# Patient Record
Sex: Male | Born: 1995 | Race: Black or African American | Hispanic: No | Marital: Single | Smoking: Current every day smoker
Health system: Southern US, Community
[De-identification: ages and names within clinical notes are randomized; demographics above are authoritative.]

---
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
## 2014-08-05 ENCOUNTER — Emergency Department (HOSPITAL_COMMUNITY): Payer: Medicaid Other

## 2014-08-05 ENCOUNTER — Emergency Department (HOSPITAL_COMMUNITY)
Admission: EM | Admit: 2014-08-05 | Discharge: 2014-08-05 | Disposition: A | Discharge status: Home / Self Care | Payer: Medicaid Other | Attending: Emergency Medicine | Admitting: Emergency Medicine

## 2014-08-05 ENCOUNTER — Encounter (HOSPITAL_COMMUNITY): Payer: Self-pay | Admitting: Emergency Medicine

## 2014-08-05 DIAGNOSIS — Z23 Encounter for immunization: Secondary | ICD-10-CM | POA: Insufficient documentation

## 2014-08-05 DIAGNOSIS — R21 Rash and other nonspecific skin eruption: Secondary | ICD-10-CM | POA: Diagnosis not present

## 2014-08-05 DIAGNOSIS — Y9389 Activity, other specified: Secondary | ICD-10-CM | POA: Diagnosis not present

## 2014-08-05 DIAGNOSIS — Y288XXA Contact with other sharp object, undetermined intent, initial encounter: Secondary | ICD-10-CM | POA: Diagnosis not present

## 2014-08-05 DIAGNOSIS — Z791 Long term (current) use of non-steroidal anti-inflammatories (NSAID): Secondary | ICD-10-CM | POA: Diagnosis not present

## 2014-08-05 DIAGNOSIS — Z720 Tobacco use: Secondary | ICD-10-CM | POA: Diagnosis not present

## 2014-08-05 DIAGNOSIS — Y998 Other external cause status: Secondary | ICD-10-CM | POA: Insufficient documentation

## 2014-08-05 DIAGNOSIS — S91331A Puncture wound without foreign body, right foot, initial encounter: Secondary | ICD-10-CM

## 2014-08-05 DIAGNOSIS — S99921A Unspecified injury of right foot, initial encounter: Secondary | ICD-10-CM | POA: Diagnosis present

## 2014-08-05 DIAGNOSIS — Y929 Unspecified place or not applicable: Secondary | ICD-10-CM | POA: Diagnosis not present

## 2014-08-05 MED ORDER — IBUPROFEN 800 MG PO TABS: 800.0000 mg | ORAL_TABLET | Freq: Three times a day (TID) | ORAL | Status: DC | PRN

## 2014-08-05 MED ORDER — IBUPROFEN 400 MG PO TABS
600.0000 mg | ORAL_TABLET | Freq: Once | ORAL | Status: AC
  Administered 2014-08-05: 600 mg via ORAL
  Filled 2014-08-05: qty 2

## 2014-08-05 MED ORDER — TETANUS-DIPHTH-ACELL PERTUSSIS 5-2.5-18.5 LF-MCG/0.5 IM SUSP
0.5000 mL | Freq: Once | INTRAMUSCULAR | Status: AC
  Administered 2014-08-05: 0.5 mL via INTRAMUSCULAR
  Filled 2014-08-05: qty 0.5

## 2014-08-05 MED ORDER — LEVOFLOXACIN 500 MG PO TABS: 500.0000 mg | ORAL_TABLET | Freq: Every day | ORAL | Status: DC

## 2014-08-05 NOTE — ED Provider Notes (Signed)
CSN: [id number]     Arrival date & time 08/05/14  1907 History  This chart was scribed for non-physician practitioner,Idamay Hosein Viola, Cordelia Poche, working with Benjiman Core, MD, by Budd Palmer ED Scribe. This patient was seen in room TR05C/TR05C and the patient's care was started at 8:19 PM    No chief complaint on file.  The history is provided by the patient. No language interpreter was used.    HPI Comments: Richard David is a 19 y.o. male who presents to the Emergency Department complaining of a painful right foot puncture wound, which occurred PTA. 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  The nail did not get stuck in his foot. He states that he immediately pulled his foot free of the nail. He reports associated stinging pain. He did not try any medication or other treatment PTA. Last tetanus was in 2010.  He denies weakness, numbness, other injury.   He also c/o a rash on his right forearm, onset 2 days ago.  He denies associated itching, pain, or burning.  He has used OTC topical cream without relief.    History reviewed. No pertinent past medical history. History reviewed. No pertinent past surgical history. No family history on file. History  Substance Use Topics  . Smoking status: Current Every Day Smoker  . Smokeless tobacco: Not on file  . Alcohol Use: No    Review of Systems  Constitutional: Negative for fever.  Cardiovascular: Negative for leg swelling.  Musculoskeletal: Negative for myalgias.  Skin: Positive for wound (Puncture on bottom of right foot). Negative for color change and pallor.  Allergic/Immunologic: Negative for immunocompromised state.  Neurological: Negative for weakness and numbness.  Hematological: Does not bruise/bleed easily.  Psychiatric/Behavioral: Negative for self-injury.    Allergies  Review of patient's allergies indicates no known  allergies.  Home Medications   Prior to Admission medications   Medication Sig Start Date End Date Taking? Authorizing Provider  ibuprofen (ADVIL,MOTRIN) 800 MG tablet Take 1 tablet (800 mg total) by mouth 3 (three) times daily. 04/06/13   Roxy Horseman, PA-C  traMADol (ULTRAM) 50 MG tablet Take by mouth every 6 (six) hours as needed for moderate pain.    Historical Provider, MD   BP 119/85 mmHg  Pulse 100  Temp(Src) 98.1 F (36.7 C) (Oral)  Resp 16  SpO2 100% Physical Exam  Constitutional: He appears well-developed and well-nourished. No distress.  HENT:  Head: Normocephalic and atraumatic.  Neck: Neck supple.  Pulmonary/Chest: Effort normal.  Musculoskeletal:       Right ankle: Normal.       Right foot: There is tenderness. There is normal range of motion, no swelling, normal capillary refill, no crepitus and no deformity.  Neurological: He is alert.  Skin: He is not diaphoretic.  Single puncture wound to the ball of the foot: plantar aspect, near 3rd MTP Hemostatic, no discharge, sensation intact Cap refill <2sec  Nursing note and vitals reviewed.   ED Course  Procedures  DIAGNOSTIC STUDIES: Oxygen Saturation is 100% on RA, normal by my interpretation.    COORDINATION OF CARE: 8:21 PM - Discussed normal imaging results. Discussed plans to order ibuprofen and Tdap. Advised to wash well at home and return to ED/PCP if it gets worse. Pt advised of plan for treatment and pt agrees.  Labs Review Labs Reviewed - No data to display  Imaging Review Dg  Foot Complete Right  08/05/2014   CLINICAL DATA:  The patient stepped on a nail with his right foot today. Initial encounter.  EXAM: RIGHT FOOT COMPLETE - 3+ VIEW  COMPARISON:  None.  FINDINGS: A marker is placed in the region of concern on the plantar surface of the right foot. No underlying fracture, dislocation or radiopaque foreign body is identified.  IMPRESSION: Negative exam.   Electronically Signed   By: Drusilla Kanner  M.D.   On: 08/05/2014 20:00     EKG Interpretation None      MDM   Final diagnoses:  Puncture wound of foot, right, initial encounter   Afebrile, nontoxic patient with right foot pain from small puncture wound.  Xray negative.  Tdap updated.  D/C home with levaquin, ibuprofen.  Discussed result, findings, treatment, and follow up  with patient.  Pt given return precautions.  Pt verbalizes understanding and agrees with plan.       I personally performed the services described in this documentation, which was scribed in my presence. The recorded information has been reviewed and is accurate.   Trixie Dredge, PA-C 08/05/14 2211  Benjiman Core, MD 08/08/14 [phone]

## 2014-08-05 NOTE — Discharge Instructions (Signed)
Read the information below.  Use the prescribed medication as directed.  Please discuss all new medications with your pharmacist.  You may return to the Emergency Department at any time for worsening condition or any new symptoms that concern you.   If you develop redness, swelling, pus draining from the wound, or fevers greater than 100.4, return to the ER immediately for a recheck.     Puncture Wound A puncture wound happens when a sharp object pokes a hole in the skin. A puncture wound can cause an infection because germs can go under the skin during the injury. HOME CARE   Change your bandage (dressing) once a day, or as told by your doctor. If the bandage sticks, soak it in water.  Keep all doctor visits as told.  Only take medicine as told by your doctor.  Take your medicine (antibiotics) as told. Finish them even if you start to feel better. You may need a tetanus shot if:  You cannot remember when you had your last tetanus shot.  You have never had a tetanus shot. If you need a tetanus shot and you choose not to have one, you may get tetanus. Sickness from tetanus can be serious. You may need a rabies shot if an animal bite caused your wound. GET HELP RIGHT AWAY IF:   Your wound is red, puffy (swollen), or painful.  You see red lines on the skin near the wound.  You have a bad smell coming from the wound or bandage.  You have yellowish-white fluid (pus) coming from the wound.  Your medicine is not working.  You notice an object in the wound.  You have a fever.  You have severe pain.  You have trouble breathing.  You feel dizzy or pass out (faint).  You keep throwing up (vomiting).  You lose feeling (numbness) in your arm or leg, or you cannot move your arm or leg.  Your problems get worse. MAKE SURE YOU:   Understand these instructions.  Will watch your condition.  Will get help right away if you are not doing well or get worse. Document Released:  11/08/2007 Document Revised: 04/23/2011 Document Reviewed: 07/18/2010 [facility] Patient Information 2015 [place], [place]. This information is not intended to replace advice given to you by your health care provider. Make sure you discuss any questions you have with your health care provider.

## 2014-08-05 NOTE — ED Notes (Signed)
Pt. stepped on a nail today , no bleeding at right foot .

## 2014-10-18 ENCOUNTER — Emergency Department (HOSPITAL_COMMUNITY)
Admission: EM | Admit: 2014-10-18 | Discharge: 2014-10-18 | Disposition: A | Discharge status: Home / Self Care | Payer: Medicaid Other | Attending: Emergency Medicine | Admitting: Emergency Medicine

## 2014-10-18 ENCOUNTER — Encounter (HOSPITAL_COMMUNITY): Payer: Self-pay | Admitting: *Deleted

## 2014-10-18 DIAGNOSIS — Z720 Tobacco use: Secondary | ICD-10-CM | POA: Insufficient documentation

## 2014-10-18 DIAGNOSIS — W2209XA Striking against other stationary object, initial encounter: Secondary | ICD-10-CM | POA: Diagnosis not present

## 2014-10-18 DIAGNOSIS — Y9289 Other specified places as the place of occurrence of the external cause: Secondary | ICD-10-CM | POA: Diagnosis not present

## 2014-10-18 DIAGNOSIS — Z79899 Other long term (current) drug therapy: Secondary | ICD-10-CM | POA: Insufficient documentation

## 2014-10-18 DIAGNOSIS — S0181XA Laceration without foreign body of other part of head, initial encounter: Secondary | ICD-10-CM | POA: Diagnosis present

## 2014-10-18 DIAGNOSIS — Y998 Other external cause status: Secondary | ICD-10-CM | POA: Diagnosis not present

## 2014-10-18 DIAGNOSIS — Y9389 Activity, other specified: Secondary | ICD-10-CM | POA: Diagnosis not present

## 2014-10-18 MED ORDER — LIDOCAINE-EPINEPHRINE-TETRACAINE (LET) SOLUTION
3.0000 mL | Freq: Once | NASAL | Status: AC
  Administered 2014-10-18: 3 mL via TOPICAL
  Filled 2014-10-18: qty 3

## 2014-10-18 NOTE — ED Notes (Signed)
The pt struck his head on a washing machine.  Small cut to thge middle of his forehead.  No loc no active bleeding

## 2014-10-18 NOTE — Discharge Instructions (Signed)
Your laceration should heal in approximately 5 days, it may take longer for the dissolvable sutures to dissolve. Please keep the laceration clean, dry, and covered when showering. Please read all discharge instructions and return precautions.    Facial Laceration  A facial laceration is a cut on the face. These injuries can be painful and cause bleeding. Lacerations usually heal quickly, but they need special care to reduce scarring. DIAGNOSIS  Your health care provider will take a medical history, ask for details about how the injury occurred, and examine the wound to determine how deep the cut is. TREATMENT  Some facial lacerations may not require closure. Others may not be able to be closed because of an increased risk of infection. The risk of infection and the chance for successful closure will depend on various factors, including the amount of time since the injury occurred. The wound may be cleaned to help prevent infection. If closure is appropriate, pain medicines may be given if needed. Your health care provider will use stitches (sutures), wound glue (adhesive), or skin adhesive strips to repair the laceration. These tools bring the skin edges together to allow for faster healing and a better cosmetic outcome. If needed, you may also be given a tetanus shot. HOME CARE INSTRUCTIONS  Only take over-the-counter or prescription medicines as directed by your health care provider.  Follow your health care provider's instructions for wound care. These instructions will vary depending on the technique used for closing the wound. For Sutures:  Keep the wound clean and dry.   If you were given a bandage (dressing), you should change it at least once a day. Also change the dressing if it becomes wet or dirty, or as directed by your health care provider.   Wash the wound with soap and water 2 times a day. Rinse the wound off with water to remove all soap. Pat the wound dry with a clean towel.    After cleaning, apply a thin layer of the antibiotic ointment recommended by your health care provider. This will help prevent infection and keep the dressing from sticking.   You may shower as usual after the first 24 hours. Do not soak the wound in water until the sutures are removed.   Get your sutures removed as directed by your health care provider. With facial lacerations, sutures should usually be taken out after 4-5 days to avoid stitch marks.   Wait a few days after your sutures are removed before applying any makeup. For Skin Adhesive Strips:  Keep the wound clean and dry.   Do not get the skin adhesive strips wet. You may bathe carefully, using caution to keep the wound dry.   If the wound gets wet, pat it dry with a clean towel.   Skin adhesive strips will fall off on their own. You may trim the strips as the wound heals. Do not remove skin adhesive strips that are still stuck to the wound. They will fall off in time.  For Wound Adhesive:  You may briefly wet your wound in the shower or bath. Do not soak or scrub the wound. Do not swim. Avoid periods of heavy sweating until the skin adhesive has fallen off on its own. After showering or bathing, gently pat the wound dry with a clean towel.   Do not apply liquid medicine, cream medicine, ointment medicine, or makeup to your wound while the skin adhesive is in place. This may loosen the film before your wound is  healed.   If a dressing is placed over the wound, be careful not to apply tape directly over the skin adhesive. This may cause the adhesive to be pulled off before the wound is healed.   Avoid prolonged exposure to sunlight or tanning lamps while the skin adhesive is in place.  The skin adhesive will usually remain in place for 5-10 days, then naturally fall off the skin. Do not pick at the adhesive film.  After Healing: Once the wound has healed, cover the wound with sunscreen during the day for 1 full  year. This can help minimize scarring. Exposure to ultraviolet light in the first year will darken the scar. It can take 1-2 years for the scar to lose its redness and to heal completely.  SEEK IMMEDIATE MEDICAL CARE IF:  You have redness, pain, or swelling around the wound.   You see ayellowish-white fluid (pus) coming from the wound.   You have chills or a fever.  MAKE SURE YOU:  Understand these instructions.  Will watch your condition.  Will get help right away if you are not doing well or get worse. Document Released: 03/08/2004 Document Revised: 11/19/2012 Document Reviewed: 09/11/2012 [facility] Patient Information 2015 [place], [place]. This information is not intended to replace advice given to you by your health care provider. Make sure you discuss any questions you have with your health care provider. Absorbable Suture Repair Absorbable sutures (stitches) hold skin together so you can heal. Keep skin wounds clean and dry for the next 2 to 3 days. Then, you may gently wash your wound and dress it with an antibiotic ointment as recommended. As your wound begins to heal, the sutures are no longer needed, and they typically begin to fall off. This will take 7 to 10 days. After 10 days, if your sutures are loose, you can remove them by wiping with a clean gauze pad or a cotton ball. Do not pull your sutures out. They should wipe away easily. If after 10 days they do not easily wipe away, have your caregiver take them out. Absorbable sutures may be used deep in a wound to help hold it together. If these stitches are below the skin, the body will absorb them completely in 3 to 4 weeks.  You may need a tetanus shot if:  You cannot remember when you had your last tetanus shot.  You have never had a tetanus shot. If you get a tetanus shot, your arm may swell, get red, and feel warm to the touch. This is common and not a problem. If you need a tetanus shot and you choose not to have one,  there is a rare chance of getting tetanus. Sickness from tetanus can be serious. SEEK IMMEDIATE MEDICAL CARE IF:  You have redness in the wound area.  The wound area feels hot to the touch.  You develop swelling in the wound area.  You develop pain.  There is fluid drainage from the wound. Document Released: 03/08/2004 Document Revised: 04/23/2011 Document Reviewed: 06/20/2010 [facility] Patient Information 2015 [place], [place]. This information is not intended to replace advice given to you by your health care provider. Make sure you discuss any questions you have with your health care provider.

## 2014-10-18 NOTE — ED Provider Notes (Signed)
CSN: [id number]     Arrival date & time 10/18/14  0329 History   First MD Initiated Contact with Patient 10/18/14 [phone]     Chief Complaint  Patient presents with  . Laceration     (Consider location/radiation/quality/duration/timing/severity/associated sxs/prior Treatment) HPI Comments: Patient is a 19 yo M presenting to the ED for a facial laceration. 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 He denies any LOC, vomiting, visual disturbance. He does endorse a mild headache and swelling to the front of his head. No modifying factors identified. Tdap is UTD. No medications PTA.   Patient is a 19 y.o. male presenting with skin laceration.  Laceration   History reviewed. No pertinent past medical history. History reviewed. No pertinent past surgical history. No family history on file. Social History  Substance Use Topics  . Smoking status: Current Every Day Smoker  . Smokeless tobacco: None  . Alcohol Use: No    Review of Systems  Skin:       + laceration  Neurological: Positive for headaches. Negative for syncope.  All other systems reviewed and are negative.     Allergies  Review of patient's allergies indicates no known allergies.  Home Medications   Prior to Admission medications   Medication Sig Start Date End Date Taking? Authorizing Provider  ibuprofen (ADVIL,MOTRIN) 800 MG tablet Take 1 tablet (800 mg total) by mouth every 8 (eight) hours as needed for mild pain or moderate pain. 08/05/14   Trixie Dredge, PA-C  levofloxacin (LEVAQUIN) 500 MG tablet Take 1 tablet (500 mg total) by mouth daily. 08/05/14   Trixie Dredge, PA-C  traMADol (ULTRAM) 50 MG tablet Take by mouth every 6 (six) hours as needed for moderate pain.    Historical Provider, MD   BP 120/91 mmHg  Pulse 66  Temp(Src) 98.1 F (36.7 C)  Resp 16  Ht  (1.854 m)  Wt 143 lb 5 oz (65.006 kg)  BMI 18.91 kg/m2  SpO2 100% Physical Exam    Constitutional: He is oriented to person, place, and time. He appears well-developed and well-nourished. No distress.  HENT:  Head: Normocephalic. Head is with laceration.    Right Ear: External ear normal.  Left Ear: External ear normal.  Nose: Nose normal.  Mouth/Throat: Oropharynx is clear and moist.  Eyes: Conjunctivae and EOM are normal. Pupils are equal, round, and reactive to light.  Neck: Normal range of motion. Neck supple.  No nuchal rigidity.   Cardiovascular: Normal rate.   Pulmonary/Chest: Effort normal.  Abdominal: Soft.  Musculoskeletal: Normal range of motion.  Neurological: He is alert and oriented to person, place, and time.  Skin: Skin is warm and dry. He is not diaphoretic.  Psychiatric: He has a normal mood and affect.  Nursing note and vitals reviewed.   ED Course  Procedures (including critical care time) Medications  lidocaine-EPINEPHrine-tetracaine (LET) solution (3 mLs Topical Given 10/18/14 0422)    Labs Review Labs Reviewed - No data to display  Imaging Review No results found. I have personally reviewed and evaluated these images and lab results as part of my medical decision-making.   EKG Interpretation None      LACERATION REPAIR Performed by: Francee Piccolo L Consent: Verbal consent obtained. Risks and benefits: risks, benefits and alternatives were discussed Patient identity confirmed: provided demographic data Time out performed prior to procedure Prepped and Draped in normal sterile fashion Wound explored  Laceration Location: forehead Laceration Length: 2 cm No Foreign Bodies seen or palpated Anesthesia: topical Local anesthetic: LET Anesthetic total: 3 ml Irrigation method: syringe Amount of cleaning: standard Skin closure: 5-0 fast absorbing gut Number of sutures or staples: 3 Technique: simple interrupted Patient tolerance: Patient tolerated the procedure well with no immediate complications.  MDM   Final  diagnoses:  Facial laceration, initial encounter    Filed Vitals:   10/18/14 0333  BP: 120/91  Pulse: 66  Temp: 98.1 F (36.7 C)  Resp: 16    Physical exam is otherwise unremarkable from laceration. Tdap UTD. Wound cleaning complete with pressure irrigation, bottom of wound visualized, no foreign bodies appreciated. Laceration occurred < 8 hours prior to repair which was well tolerated. Pt has no co morbidities to effect normal wound healing. Discussed suture home care w parent/guardian and answered questions. Pt to f-u for recheck in 5-7 days. Return precautions discussed. Patient agreeable to plan. Pt is hemodynamically stable w no complaints prior to dc.     Francee Piccolo, PA-C 10/18/14 0532  Layla Maw Ward, DO 10/18/14 4098

## 2014-12-15 ENCOUNTER — Emergency Department (HOSPITAL_COMMUNITY)
Admission: EM | Admit: 2014-12-15 | Discharge: 2014-12-15 | Disposition: A | Discharge status: Home / Self Care | Payer: Medicaid Other | Attending: Emergency Medicine | Admitting: Emergency Medicine

## 2014-12-15 ENCOUNTER — Emergency Department (HOSPITAL_COMMUNITY): Payer: Medicaid Other

## 2014-12-15 ENCOUNTER — Encounter (HOSPITAL_COMMUNITY): Payer: Self-pay | Admitting: *Deleted

## 2014-12-15 DIAGNOSIS — X58XXXA Exposure to other specified factors, initial encounter: Secondary | ICD-10-CM | POA: Diagnosis not present

## 2014-12-15 DIAGNOSIS — S63501A Unspecified sprain of right wrist, initial encounter: Secondary | ICD-10-CM | POA: Insufficient documentation

## 2014-12-15 DIAGNOSIS — Y998 Other external cause status: Secondary | ICD-10-CM | POA: Insufficient documentation

## 2014-12-15 DIAGNOSIS — Z79899 Other long term (current) drug therapy: Secondary | ICD-10-CM | POA: Insufficient documentation

## 2014-12-15 DIAGNOSIS — Z720 Tobacco use: Secondary | ICD-10-CM | POA: Insufficient documentation

## 2014-12-15 DIAGNOSIS — Y9389 Activity, other specified: Secondary | ICD-10-CM | POA: Diagnosis not present

## 2014-12-15 DIAGNOSIS — Y9289 Other specified places as the place of occurrence of the external cause: Secondary | ICD-10-CM | POA: Diagnosis not present

## 2014-12-15 DIAGNOSIS — M25531 Pain in right wrist: Secondary | ICD-10-CM | POA: Diagnosis present

## 2014-12-15 MED ORDER — NAPROXEN 250 MG PO TABS
500.0000 mg | ORAL_TABLET | Freq: Once | ORAL | Status: AC
  Administered 2014-12-15: 500 mg via ORAL
  Filled 2014-12-15: qty 2

## 2014-12-15 MED ORDER — NAPROXEN 500 MG PO TABS: 500.0000 mg | ORAL_TABLET | Freq: Two times a day (BID) | ORAL | Status: DC

## 2014-12-15 NOTE — Discharge Instructions (Signed)
Wrist Sprain With Rehab Follow up with your primary care physician. Take naproxen for pain. Wear wrist splint up to 5 days. A sprain is an injury in which a ligament that maintains the proper alignment of a joint is partially or completely torn. The ligaments of the wrist are susceptible to sprains. Sprains are classified into three categories. Grade 1 sprains cause pain, but the tendon is not lengthened. Grade 2 sprains include a lengthened ligament because the ligament is stretched or partially ruptured. With grade 2 sprains there is still function, although the function may be diminished. Grade 3 sprains are characterized by a complete tear of the tendon or muscle, and function is usually impaired. SYMPTOMS   Pain tenderness, inflammation, and/or bruising (contusion) of the injury.  A "pop" or tear felt and/or heard at the time of injury.  Decreased wrist function. CAUSES  A wrist sprain occurs when a force is placed on one or more ligaments that is greater than it/they can withstand. Common mechanisms of injury include:  Catching a ball with your hands.  Repetitive and/ or strenuous extension or flexion of the wrist. RISK INCREASES WITH:  Previous wrist injury.  Contact sports (boxing or wrestling).  Activities in which falling is common.  Poor strength and flexibility.  Improperly fitted or padded protective equipment. PREVENTION  Warm up and stretch properly before activity.  Allow for adequate recovery between workouts.  Maintain physical fitness:  Strength, flexibility, and endurance.  Cardiovascular fitness.  Protect the wrist joint by limiting its motion with the use of taping, braces, or splints.  Protect the wrist after injury for 6 to 12 months. PROGNOSIS  The prognosis for wrist sprains depends on the degree of injury. Grade 1 sprains require 2 to 6 weeks of treatment. Grade 2 sprains require 6 to 8 weeks of treatment, and grade 3 sprains require up to 12  weeks.  RELATED COMPLICATIONS   Prolonged healing time, if improperly treated or re-injured.  Recurrent symptoms that result in a chronic problem.  Injury to nearby structures (bone, cartilage, nerves, or tendons).  Arthritis of the wrist.  Inability to compete in athletics at a high level.  Wrist stiffness or weakness.  Progression to a complete rupture of the ligament. TREATMENT  Treatment initially involves resting from any activities that aggravate the symptoms, and the use of ice and medications to help reduce pain and inflammation. Your caregiver may recommend immobilizing the wrist for a period of time in order to reduce stress on the ligament and allow for healing. After immobilization it is important to perform strengthening and stretching exercises to help regain strength and a full range of motion. These exercises may be completed at home or with a therapist. Surgery is not usually required for wrist sprains, unless the ligament has been ruptured (grade 3 sprain). MEDICATION   If pain medication is necessary, then nonsteroidal anti-inflammatory medications, such as aspirin and ibuprofen, or other minor pain relievers, such as acetaminophen, are often recommended.  Do not take pain medication for 7 days before surgery.  Prescription pain relievers may be given if deemed necessary by your caregiver. Use only as directed and only as much as you need. HEAT AND COLD  Cold treatment (icing) relieves pain and reduces inflammation. Cold treatment should be applied for 10 to 15 minutes every 2 to 3 hours for inflammation and pain and immediately after any activity that aggravates your symptoms. Use ice packs or massage the area with a piece of ice (ice  massage).  Heat treatment may be used prior to performing the stretching and strengthening activities prescribed by your caregiver, physical therapist, or athletic trainer. Use a heat pack or soak your injury in warm water. SEEK MEDICAL  CARE IF:  Treatment seems to offer no benefit, or the condition worsens.  Any medications produce adverse side effects. EXERCISES RANGE OF MOTION (ROM) AND STRETCHING EXERCISES - Wrist Sprain  These exercises may help you when beginning to rehabilitate your injury. Your symptoms may resolve with or without further involvement from your physician, physical therapist or athletic trainer. While completing these exercises, remember:   Restoring tissue flexibility helps normal motion to return to the joints. This allows healthier, less painful movement and activity.  An effective stretch should be held for at least 30 seconds.  A stretch should never be painful. You should only feel a gentle lengthening or release in the stretched tissue. RANGE OF MOTION - Wrist Flexion, Active-Assisted  Extend your right / left elbow with your fingers pointing down.*  Gently pull the back of your hand towards you until you feel a gentle stretch on the top of your forearm.  Hold this position for __________ seconds. Repeat __________ times. Complete this exercise __________ times per day.  *If directed by your physician, physical therapist or athletic trainer, complete this stretch with your elbow bent rather than extended. RANGE OF MOTION - Wrist Extension, Active-Assisted  Extend your right / left elbow and turn your palm upwards.*  Gently pull your palm/fingertips back so your wrist extends and your fingers point more toward the ground.  You should feel a gentle stretch on the inside of your forearm.  Hold this position for __________ seconds. Repeat __________ times. Complete this exercise __________ times per day. *If directed by your physician, physical therapist or athletic trainer, complete this stretch with your elbow bent, rather than extended. RANGE OF MOTION - Supination, Active  Stand or sit with your elbows at your side. Bend your right / left elbow to 90 degrees.  Turn your palm upward  until you feel a gentle stretch on the inside of your forearm.  Hold this position for __________ seconds. Slowly release and return to the starting position. Repeat __________ times. Complete this stretch __________ times per day.  RANGE OF MOTION - Pronation, Active  Stand or sit with your elbows at your side. Bend your right / left elbow to 90 degrees.  Turn your palm downward until you feel a gentle stretch on the top of your forearm.  Hold this position for __________ seconds. Slowly release and return to the starting position. Repeat __________ times. Complete this stretch __________ times per day.  STRETCH - Wrist Flexion  Place the back of your right / left hand on a tabletop leaving your elbow slightly bent. Your fingers should point away from your body.  Gently press the back of your hand down onto the table by straightening your elbow. You should feel a stretch on the top of your forearm.  Hold this position for __________ seconds. Repeat __________ times. Complete this stretch __________ times per day.  STRETCH - Wrist Extension  Place your right / left fingertips on a tabletop leaving your elbow slightly bent. Your fingers should point backwards.  Gently press your fingers and palm down onto the table by straightening your elbow. You should feel a stretch on the inside of your forearm.  Hold this position for __________ seconds. Repeat __________ times. Complete this stretch __________ times per  day.  STRENGTHENING EXERCISES - Wrist Sprain These exercises may help you when beginning to rehabilitate your injury. They may resolve your symptoms with or without further involvement from your physician, physical therapist or athletic trainer. While completing these exercises, remember:   Muscles can gain both the endurance and the strength needed for everyday activities through controlled exercises.  Complete these exercises as instructed by your physician, physical therapist  or athletic trainer. Progress with the resistance and repetition exercises only as your caregiver advises. STRENGTH - Wrist Flexors  Sit with your right / left forearm palm-up and fully supported. Your elbow should be resting below the height of your shoulder. Allow your wrist to extend over the edge of the surface.  Loosely holding a __________ weight or a piece of rubber exercise band/tubing, slowly curl your hand up toward your forearm.  Hold this position for __________ seconds. Slowly lower the wrist back to the starting position in a controlled manner. Repeat __________ times. Complete this exercise __________ times per day.  STRENGTH - Wrist Extensors  Sit with your right / left forearm palm-down and fully supported. Your elbow should be resting below the height of your shoulder. Allow your wrist to extend over the edge of the surface.  Loosely holding a __________ weight or a piece of rubber exercise band/tubing, slowly curl your hand up toward your forearm.  Hold this position for __________ seconds. Slowly lower the wrist back to the starting position in a controlled manner. Repeat __________ times. Complete this exercise __________ times per day.  STRENGTH - Ulnar Deviators  Stand with a ____________________ weight in your right / left hand, or sit holding on to the rubber exercise band/tubing with your opposite arm supported.  Move your wrist so that your pinkie travels toward your forearm and your thumb moves away from your forearm.  Hold this position for __________ seconds and then slowly lower the wrist back to the starting position. Repeat __________ times. Complete this exercise __________ times per day STRENGTH - Radial Deviators  Stand with a ____________________ weight in your  right / left hand, or sit holding on to the rubber exercise band/tubing with your arm supported.  Raise your hand upward in front of you or pull up on the rubber tubing.  Hold this  position for __________ seconds and then slowly lower the wrist back to the starting position. Repeat __________ times. Complete this exercise __________ times per day. STRENGTH - Forearm Supinators  Sit with your right / left forearm supported on a table, keeping your elbow below shoulder height. Rest your hand over the edge, palm down.  Gently grip a hammer or a soup ladle.  Without moving your elbow, slowly turn your palm and hand upward to a "thumbs-up" position.  Hold this position for __________ seconds. Slowly return to the starting position. Repeat __________ times. Complete this exercise __________ times per day.  STRENGTH - Forearm Pronators  Sit with your right / left forearm supported on a table, keeping your elbow below shoulder height. Rest your hand over the edge, palm up.  Gently grip a hammer or a soup ladle.  Without moving your elbow, slowly turn your palm and hand upward to a "thumbs-up" position.  Hold this position for __________ seconds. Slowly return to the starting position. Repeat __________ times. Complete this exercise __________ times per day.  STRENGTH - Grip  Grasp a tennis ball, a dense sponge, or a large, rolled sock in your hand.  Squeeze as hard  as you can without increasing any pain.  Hold this position for __________ seconds. Release your grip slowly. Repeat __________ times. Complete this exercise __________ times per day.    This information is not intended to replace advice given to you by your health care provider. Make sure you discuss any questions you have with your health care provider.   Document Released: 01/29/2005 Document Revised: 10/20/2014 Document Reviewed: 05/13/2008 Elsevier Interactive Patient Education [employer]

## 2014-12-15 NOTE — ED Notes (Signed)
The pt is c/o rt wriust pain all day  No known injury.  Painful when he twists it.

## 2014-12-15 NOTE — ED Provider Notes (Signed)
CSN: [id number]     Arrival date & time 12/15/14  2002 History  By signing my name below, I, Evon Slackerrance Branch, attest that this documentation has been prepared under the direction and in the presence of Federated Department StoresHanna Patel-Mills, PA-C. Electronically Signed: Evon Slackerrance Branch, ED Scribe. 12/15/2014. 10:07 PM.     Chief Complaint  Patient presents with  . Wrist Pain   The history is provided by the patient. No language interpreter was used.   HPI Comments: Richard David is a 19 y.o. male who presents to the Emergency Department complaining of new right wrist pain that began today. Pt denies any medications or treatments PTA. Pt states that the pain is worse with certain movements. Pt states that when he rest the wrist theres is no pain. Pt denies injury or fall. Pt doesn't report numbness or tingling.     History reviewed. No pertinent past medical history. History reviewed. No pertinent past surgical history. No family history on file. Social History  Substance Use Topics  . Smoking status: Current Every Day Smoker  . Smokeless tobacco: None  . Alcohol Use: No    Review of Systems  Musculoskeletal: Positive for joint swelling and arthralgias.  Neurological: Negative for numbness.     Allergies  Review of patient's allergies indicates no known allergies.  Home Medications   Prior to Admission medications   Medication Sig Start Date End Date Taking? Authorizing Provider  ibuprofen (ADVIL,MOTRIN) 800 MG tablet Take 1 tablet (800 mg total) by mouth every 8 (eight) hours as needed for mild pain or moderate pain. 08/05/14   Trixie DredgeEmily West, PA-C  levofloxacin (LEVAQUIN) 500 MG tablet Take 1 tablet (500 mg total) by mouth daily. 08/05/14   Trixie DredgeEmily West, PA-C  naproxen (NAPROSYN) 500 MG tablet Take 1 tablet (500 mg total) by mouth 2 (two) times daily. 12/15/14   Jamilett Ferrante Patel-Mills, PA-C  traMADol (ULTRAM) 50 MG tablet Take by mouth every 6 (six) hours as needed for moderate pain.    Historical Provider,  MD   BP 117/84 mmHg  Pulse 77  Temp(Src) 98.2 F (36.8 C) (Oral)  Resp 18  Ht 6\' 1"  (1.854 m)  Wt 141 lb 7 oz (64.156 kg)  BMI 18.66 kg/m2  SpO2 100%   Physical Exam  Constitutional: He is oriented to person, place, and time. He appears well-developed and well-nourished. No distress.  HENT:  Head: Normocephalic and atraumatic.  Eyes: Conjunctivae and EOM are normal.  Neck: Neck supple. No tracheal deviation present.  Cardiovascular: Normal rate.   Pulmonary/Chest: Effort normal. No respiratory distress.  Musculoskeletal: Normal range of motion.  2+ radial pulse, able to flex and extend all fingers, pain  with supination and pronation, tenderness to the radial side of the wrist but no swelling or deformity, no snuff box tenderness.   Neurological: He is alert and oriented to person, place, and time.  Skin: Skin is warm and dry.  Psychiatric: He has a normal mood and affect. His behavior is normal.  Nursing note and vitals reviewed.   ED Course  Procedures (including critical care time) DIAGNOSTIC STUDIES: Oxygen Saturation is 99% on RA, normal by my interpretation.    COORDINATION OF CARE: 8:30 PM-Discussed treatment plan with pt at bedside and pt agreed to plan.     Labs Review Labs Reviewed - No data to display  Imaging Review Dg Wrist Complete Right  12/15/2014  CLINICAL DATA:  Wrist pain today EXAM: RIGHT WRIST - COMPLETE 3+ VIEW COMPARISON:  None. FINDINGS:  No acute fracture.  No dislocation.  Unremarkable soft tissues. IMPRESSION: No acute bony pathology. Electronically Signed   By: Jolaine Click M.D.   On: 12/15/2014 21:38      EKG Interpretation None      MDM   Final diagnoses:  Wrist sprain, right, initial encounter  Patient presents with right wrist pain without injury. Wrist x-ray is negative for any acute bony abnormality. This is most likely a wrist sprain. He was given a wrist splint. I prescribed naproxen for pain. He has follow-up with a primary  care physician. Medications  naproxen (NAPROSYN) tablet 500 mg (500 mg Oral Given 12/15/14 2048)   Filed Vitals:   12/15/14 2203  BP: 117/84  Pulse: 77  Temp: 98.2 F (36.8 C)  Resp: 18   I personally performed the services described in this documentation, which was scribed in my presence. The recorded information has been reviewed and is accurate.      Catha Gosselin, PA-C 12/15/14 2207  Lavera Guise, MD 12/17/14 1226

## 2015-02-16 ENCOUNTER — Encounter (HOSPITAL_COMMUNITY): Payer: Self-pay | Admitting: *Deleted

## 2015-02-16 DIAGNOSIS — E860 Dehydration: Secondary | ICD-10-CM | POA: Diagnosis not present

## 2015-02-16 DIAGNOSIS — Z791 Long term (current) use of non-steroidal anti-inflammatories (NSAID): Secondary | ICD-10-CM | POA: Insufficient documentation

## 2015-02-16 DIAGNOSIS — Z792 Long term (current) use of antibiotics: Secondary | ICD-10-CM | POA: Diagnosis not present

## 2015-02-16 DIAGNOSIS — F17200 Nicotine dependence, unspecified, uncomplicated: Secondary | ICD-10-CM | POA: Insufficient documentation

## 2015-02-16 DIAGNOSIS — R42 Dizziness and giddiness: Secondary | ICD-10-CM | POA: Diagnosis present

## 2015-02-16 LAB — BASIC METABOLIC PANEL
Anion gap: 12 (ref 5–15)
BUN: 12 mg/dL (ref 6–20)
CHLORIDE: 98 mmol/L — AB (ref 101–111)
CO2: 28 mmol/L (ref 22–32)
CREATININE: 0.89 mg/dL (ref 0.61–1.24)
Calcium: 9.6 mg/dL (ref 8.9–10.3)
GFR calc Af Amer: >60 mL/min (ref >60)
GFR calc non Af Amer: >60 mL/min (ref >60)
GLUCOSE: 81 mg/dL (ref 65–99)
Potassium: 3.2 mmol/L — ABNORMAL LOW (ref 3.5–5.1)
Sodium: 138 mmol/L (ref 135–145)

## 2015-02-16 LAB — URINALYSIS, ROUTINE W REFLEX MICROSCOPIC
Bilirubin Urine: NEGATIVE
GLUCOSE, UA: NEGATIVE mg/dL
KETONES UR: NEGATIVE mg/dL
Leukocytes, UA: NEGATIVE
Nitrite: NEGATIVE
PROTEIN: NEGATIVE mg/dL
Specific Gravity, Urine: 1.027 (ref 1.005–1.030)
pH: 6 (ref 5.0–8.0)

## 2015-02-16 LAB — CBC
HCT: 49 % (ref 39.0–52.0)
Hemoglobin: 16 g/dL (ref 13.0–17.0)
MCH: 27.4 pg (ref 26.0–34.0)
MCHC: 32.7 g/dL (ref 30.0–36.0)
MCV: 84 fL (ref 78.0–100.0)
PLATELETS: 210 10*3/uL (ref 150–400)
RBC: 5.83 MIL/uL — ABNORMAL HIGH (ref 4.22–5.81)
RDW: 13.5 % (ref 11.5–15.5)
WBC: 7.7 10*3/uL (ref 4.0–10.5)

## 2015-02-16 LAB — URINE MICROSCOPIC-ADD ON: WBC, UA: NONE SEEN WBC/hpf (ref 0–5)

## 2015-02-16 MED ORDER — ONDANSETRON 4 MG PO TBDP
4.0000 mg | ORAL_TABLET | Freq: Once | ORAL | Status: AC | PRN
  Administered 2015-02-16: 4 mg via ORAL

## 2015-02-16 MED ORDER — ONDANSETRON 4 MG PO TBDP
ORAL_TABLET | ORAL | Status: AC
  Filled 2015-02-16: qty 1

## 2015-02-16 NOTE — ED Notes (Signed)
Pt states that yesterday he vomited multiple times, states that has subsided but is feeling dizzy. He has eaten a cheeseburger and held down fluids today.

## 2015-02-16 NOTE — ED Notes (Signed)
Pt vomiting in triage 

## 2015-02-17 ENCOUNTER — Emergency Department (HOSPITAL_COMMUNITY)
Admission: EM | Admit: 2015-02-17 | Discharge: 2015-02-17 | Disposition: A | Discharge status: Home / Self Care | Payer: Medicaid Other | Attending: Emergency Medicine | Admitting: Emergency Medicine

## 2015-02-17 DIAGNOSIS — R197 Diarrhea, unspecified: Secondary | ICD-10-CM

## 2015-02-17 DIAGNOSIS — E860 Dehydration: Secondary | ICD-10-CM

## 2015-02-17 DIAGNOSIS — R112 Nausea with vomiting, unspecified: Secondary | ICD-10-CM

## 2015-02-17 MED ORDER — ONDANSETRON 8 MG PO TBDP: 8.0000 mg | ORAL_TABLET | Freq: Three times a day (TID) | ORAL | Status: DC | PRN

## 2015-02-17 MED ORDER — SODIUM CHLORIDE 0.9 % IV BOLUS (SEPSIS)
1000.0000 mL | Freq: Once | INTRAVENOUS | Status: AC
  Administered 2015-02-17: 1000 mL via INTRAVENOUS

## 2015-02-17 NOTE — Discharge Instructions (Signed)
zofran as prescribed as needed for nausea. Drink plenty of fluids. Follow up with your doctor. Return if worsening.   Viral Gastroenteritis Viral gastroenteritis is also known as stomach flu. This condition affects the stomach and intestinal tract. It can cause sudden diarrhea and vomiting. The illness typically lasts 3 to 8 days. Most people develop an immune response that eventually gets rid of the virus. While this natural response develops, the virus can make you quite ill. CAUSES  Many different viruses can cause gastroenteritis, such as rotavirus or noroviruses. You can catch one of these viruses by consuming contaminated food or water. You may also catch a virus by sharing utensils or other personal items with an infected person or by touching a contaminated surface. SYMPTOMS  The most common symptoms are diarrhea and vomiting. These problems can cause a severe loss of body fluids (dehydration) and a body salt (electrolyte) imbalance. Other symptoms may include:  Fever.  Headache.  Fatigue.  Abdominal pain. DIAGNOSIS  Your caregiver can usually diagnose viral gastroenteritis based on your symptoms and a physical exam. A stool sample may also be taken to test for the presence of viruses or other infections. TREATMENT  This illness typically goes away on its own. Treatments are aimed at rehydration. The most serious cases of viral gastroenteritis involve vomiting so severely that you are not able to keep fluids down. In these cases, fluids must be given through an intravenous line (IV). HOME CARE INSTRUCTIONS   Drink enough fluids to keep your urine clear or pale yellow. Drink small amounts of fluids frequently and increase the amounts as tolerated.  Ask your caregiver for specific rehydration instructions.  Avoid:  Foods high in sugar.  Alcohol.  Carbonated drinks.  Tobacco.  Juice.  Caffeine drinks.  Extremely hot or cold fluids.  Fatty, greasy foods.  Too much  intake of anything at one time.  Dairy products until 24 to 48 hours after diarrhea stops.  You may consume probiotics. Probiotics are active cultures of beneficial bacteria. They may lessen the amount and number of diarrheal stools in adults. Probiotics can be found in yogurt with active cultures and in supplements.  Wash your hands well to avoid spreading the virus.  Only take over-the-counter or prescription medicines for pain, discomfort, or fever as directed by your caregiver. Do not give aspirin to children. Antidiarrheal medicines are not recommended.  Ask your caregiver if you should continue to take your regular prescribed and over-the-counter medicines.  Keep all follow-up appointments as directed by your caregiver. SEEK IMMEDIATE MEDICAL CARE IF:   You are unable to keep fluids down.  You do not urinate at least once every 6 to 8 hours.  You develop shortness of breath.  You notice blood in your stool or vomit. This may look like coffee grounds.  You have abdominal pain that increases or is concentrated in one small area (localized).  You have persistent vomiting or diarrhea.  You have a fever.  The patient is a child younger than 3 months, and he or she has a fever.  The patient is a child older than 3 months, and he or she has a fever and persistent symptoms.  The patient is a child older than 3 months, and he or she has a fever and symptoms suddenly get worse.  The patient is a baby, and he or she has no tears when crying. MAKE SURE YOU:   Understand these instructions.  Will watch your condition.  Will get  help right away if you are not doing well or get worse.   This information is not intended to replace advice given to you by your health care provider. Make sure you discuss any questions you have with your health care provider.   Document Released: 01/29/2005 Document Revised: 04/23/2011 Document Reviewed: 11/15/2010 Elsevier Interactive Patient  Education [employer]

## 2015-02-17 NOTE — ED Provider Notes (Signed)
CSN: [id number]     Arrival date & time 02/16/15  2145 History   First MD Initiated Contact with Patient 02/17/15 0251     Chief Complaint  Patient presents with  . Dizziness     (Consider location/radiation/quality/duration/timing/severity/associated sxs/prior Treatment) HPI Richard David is a 20 y.o. male [place] QuinLinda medical problems, presents to emergency department complaining of nausea, vomiting, diarrhea, dizziness. Patient states symptoms started yesterday. He states he spent all day throwing up and diarrhea, could not keep anything down. He reports several episodes of emesis today, states diarrhea has improved. He did throw up once in triage. He was given ODT Zofran which he thinks helped. He has been able to drink water while in the waiting room. He denies any abdominal pain. He denies any blood in his stool or emesis. He denies any recent ill contacts. No other medications taken prior to coming in. He states he feels lightheaded when he stands up. Denies any fever or chills. No urinary symptoms.  History reviewed. No pertinent past medical history. History reviewed. No pertinent past surgical history. No family history on file. Social History  Substance Use Topics  . Smoking status: Current Every Day Smoker  . Smokeless tobacco: None  . Alcohol Use: No    Review of Systems  Constitutional: Negative for fever and chills.  Respiratory: Negative for cough, chest tightness and shortness of breath.   Cardiovascular: Negative for chest pain, palpitations and leg swelling.  Gastrointestinal: Positive for nausea, vomiting and diarrhea. Negative for abdominal pain and abdominal distention.  Genitourinary: Negative for dysuria, urgency, frequency and hematuria.  Musculoskeletal: Negative for myalgias, arthralgias, neck pain and neck stiffness.  Skin: Negative for rash.  Allergic/Immunologic: Negative for immunocompromised state.  Neurological: Negative for dizziness, weakness,  light-headedness, numbness and headaches.  All other systems reviewed and are negative.     Allergies  Review of patient's allergies indicates no known allergies.  Home Medications   Prior to Admission medications   Medication Sig Start Date End Date Taking? Authorizing Provider  ibuprofen (ADVIL,MOTRIN) 800 MG tablet Take 1 tablet (800 mg total) by mouth every 8 (eight) hours as needed for mild pain or moderate pain. 08/05/14   Trixie DredgeEmily West, PA-C  levofloxacin (LEVAQUIN) 500 MG tablet Take 1 tablet (500 mg total) by mouth daily. 08/05/14   Trixie DredgeEmily West, PA-C  naproxen (NAPROSYN) 500 MG tablet Take 1 tablet (500 mg total) by mouth 2 (two) times daily. 12/15/14   Hanna Patel-Mills, PA-C  traMADol (ULTRAM) 50 MG tablet Take by mouth every 6 (six) hours as needed for moderate pain.    Historical Provider, MD   BP 108/75 mmHg  Pulse 81  Temp(Src) 99.1 F (37.3 C) (Oral)  Resp 16  SpO2 100% Physical Exam  Constitutional: He is oriented to person, place, and time. He appears well-developed and well-nourished. No distress.  HENT:  Head: Normocephalic and atraumatic.  Eyes: Conjunctivae are normal.  Neck: Neck supple.  Cardiovascular: Normal rate, regular rhythm and normal heart sounds.   Pulmonary/Chest: Effort normal. No respiratory distress. He has no wheezes. He has no rales.  Abdominal: Soft. Bowel sounds are normal. He exhibits no distension. There is no tenderness. There is no rebound and no guarding.  Musculoskeletal: He exhibits no edema.  Neurological: He is alert and oriented to person, place, and time.  Skin: Skin is warm and dry.  Nursing note and vitals reviewed.   ED Course  Procedures (including critical care time) Labs Review Labs Reviewed  BASIC  METABOLIC PANEL - Abnormal; Notable for the following:    Potassium 3.2 (*)    Chloride 98 (*)    All other components within normal limits  CBC - Abnormal; Notable for the following:    RBC 5.83 (*)    All other components  within normal limits  URINALYSIS, ROUTINE W REFLEX MICROSCOPIC (NOT AT [facility]) - Abnormal; Notable for the following:    Color, Urine AMBER (*)    Hgb urine dipstick SMALL (*)    All other components within normal limits  URINE MICROSCOPIC-ADD ON - Abnormal; Notable for the following:    Squamous Epithelial / LPF 0-5 (*)    Bacteria, UA RARE (*)    All other components within normal limits    Imaging Review No results found. I have personally reviewed and evaluated these images and lab results as part of my medical decision-making.   EKG Interpretation None      MDM   Final diagnoses:  Dehydration  Nausea vomiting and diarrhea    Pt with nauesa, vomiting, diarrhea, dizziness. Abdomen non tender. VS normal. Mildly orthostatic. Was given zofran ODT by triage. Pt feeling better, still some dizziness. Will give fluids.   5:02 AM Pt feels much better after 2L of NS. Abdomen continues to be non tender. Most likely viral gastroenteritis. Pt tolerating POs. Home with PCP follow up and zofran as needed for nausea.   Filed Vitals:   02/16/15 2149 02/17/15 0213 02/17/15 0341 02/17/15 0445  BP: 117/83 114/78 108/75 111/75  Pulse: 109 93 81 78  Temp: 98.7 F (37.1 C) 99.1 F (37.3 C)    TempSrc: Oral Oral    Resp: 18 16 16 16   SpO2: 99% 100% 100% 100%       Jaynie Crumble, PA-C 02/17/15 0503  Tomasita Crumble, MD 02/17/15 [phone]

## 2015-02-17 NOTE — ED Notes (Signed)
Pt verbalized understanding of d/c instructions, prescriptions, and follow-up care. No further questions/concerns, VSS, ambulatory w/ steady gait (refused wheelchair) 

## 2015-02-17 NOTE — ED Notes (Signed)
Pt eating chips, seems to be tolerating well.

## 2016-07-09 ENCOUNTER — Encounter (HOSPITAL_COMMUNITY): Payer: Self-pay | Admitting: *Deleted

## 2016-07-09 ENCOUNTER — Emergency Department (HOSPITAL_COMMUNITY)
Admission: EM | Admit: 2016-07-09 | Discharge: 2016-07-09 | Disposition: A | Discharge status: Home / Self Care | Payer: Medicaid Other | Attending: Emergency Medicine | Admitting: Emergency Medicine

## 2016-07-09 DIAGNOSIS — R1111 Vomiting without nausea: Secondary | ICD-10-CM

## 2016-07-09 DIAGNOSIS — R1033 Periumbilical pain: Secondary | ICD-10-CM

## 2016-07-09 DIAGNOSIS — R1110 Vomiting, unspecified: Secondary | ICD-10-CM | POA: Insufficient documentation

## 2016-07-09 DIAGNOSIS — Z79899 Other long term (current) drug therapy: Secondary | ICD-10-CM | POA: Insufficient documentation

## 2016-07-09 DIAGNOSIS — F17210 Nicotine dependence, cigarettes, uncomplicated: Secondary | ICD-10-CM | POA: Insufficient documentation

## 2016-07-09 LAB — I-STAT CHEM 8, ED
BUN: 11 mg/dL (ref 6–20)
CREATININE: 0.7 mg/dL (ref 0.61–1.24)
Calcium, Ion: 1.22 mmol/L (ref 1.15–1.40)
Chloride: 102 mmol/L (ref 101–111)
Glucose, Bld: 71 mg/dL (ref 65–99)
HEMATOCRIT: 44 % (ref 39.0–52.0)
HEMOGLOBIN: 15 g/dL (ref 13.0–17.0)
POTASSIUM: 3.9 mmol/L (ref 3.5–5.1)
Sodium: 139 mmol/L (ref 135–145)
TCO2: 28 mmol/L (ref 0–100)

## 2016-07-09 NOTE — Discharge Instructions (Signed)
Return for worsening symptoms.    

## 2016-07-09 NOTE — ED Provider Notes (Signed)
[facility]-EMERGENCY DEPT Provider Note   CSN: [id number] Arrival date & time: 07/09/16  1613     History   Chief Complaint Chief Complaint  Patient presents with  . Emesis    HPI Richard David is a 21 y.o. male who presents with abdominal cramping, nausea and vomiting. No significant past medical history. He states that yesterday he felt in his usual state of health. He works at [employer] and ate there last night. He woke up this morning and had abdominal cramping. He subsequently felt very nauseous and one episode of vomiting. He immediately felt better afterwards and has been able to tolerate by mouth. He states he came to the ED to "get checked". Denies any fever, chills, chest pain, shortness of breath, current abdominal pain or cramping, current nausea or recent vomiting, diarrhea, dysuria. He denies ETOH or drug use.  HPI  History reviewed. No pertinent past medical history.  Patient Active Problem List   Diagnosis Date Noted  . CLOSED FRACTURE OF SHAFT OF TIBIA 12/14/2008    History reviewed. No pertinent surgical history.     Home Medications    Prior to Admission medications   Medication Sig Start Date End Date Taking? Authorizing Provider  ibuprofen (ADVIL,MOTRIN) 800 MG tablet Take 1 tablet (800 mg total) by mouth every 8 (eight) hours as needed for mild pain or moderate pain. 08/05/14   Trixie Dredge, PA-C  levofloxacin (LEVAQUIN) 500 MG tablet Take 1 tablet (500 mg total) by mouth daily. 08/05/14   Trixie Dredge, PA-C  naproxen (NAPROSYN) 500 MG tablet Take 1 tablet (500 mg total) by mouth 2 (two) times daily. 12/15/14   Patel-Mills, Lorelle Formosa, PA-C  ondansetron (ZOFRAN ODT) 8 MG disintegrating tablet Take 1 tablet (8 mg total) by mouth every 8 (eight) hours as needed for nausea or vomiting. 02/17/15   Kirichenko, Lemont Fillers, PA-C  traMADol (ULTRAM) 50 MG tablet Take by mouth every 6 (six) hours as needed for moderate pain.    [provider]    Family History No  family history on file.  Social History Social History  Substance Use Topics  . Smoking status: Current Every Day Smoker    Packs/day: 0.15    Types: Cigarettes  . Smokeless tobacco: Never Used  . Alcohol use No     Allergies   Patient has no known allergies.   Review of Systems Review of Systems  Constitutional: Negative for chills and fever.  Respiratory: Negative for shortness of breath.   Cardiovascular: Negative for chest pain.  Gastrointestinal: Positive for abdominal pain (resolved), nausea and vomiting (resolved). Negative for diarrhea.  Genitourinary: Negative for dysuria and flank pain.  All other systems reviewed and are negative.    Physical Exam Updated Vital Signs BP 105/69   Pulse 61   Temp 98.2 F (36.8 C) (Oral)   Resp 17   Ht 6\' 2"  (1.88 m)   Wt 68 kg (150 lb)   SpO2 100%   BMI 19.26 kg/m   Physical Exam  Constitutional: He is oriented to person, place, and time. He appears well-developed and well-nourished. No distress.  HENT:  Head: Normocephalic and atraumatic.  Eyes: Conjunctivae are normal. Pupils are equal, round, and reactive to light. Right eye exhibits no discharge. Left eye exhibits no discharge. No scleral icterus.  Neck: Normal range of motion.  Cardiovascular: Normal rate and regular rhythm.  Exam reveals no gallop and no friction rub.   No murmur heard. Pulmonary/Chest: Effort normal and breath sounds normal.  No respiratory distress. He has no wheezes. He has no rales. He exhibits no tenderness.  Abdominal: Soft. Bowel sounds are normal. He exhibits no distension. There is no tenderness.  Neurological: He is alert and oriented to person, place, and time.  Skin: Skin is warm and dry.  Psychiatric: He has a normal mood and affect. His behavior is normal.  Nursing note and vitals reviewed.    ED Treatments / Results  Labs (all labs ordered are listed, but only abnormal results are displayed) Labs Reviewed  I-STAT CHEM 8, ED     EKG  EKG Interpretation None       Radiology No results found.  Procedures Procedures (including critical care time)  Medications Ordered in ED Medications - No data to display   Initial Impression / Assessment and Plan / ED Course  I have reviewed the triage vital signs and the nursing notes.  Pertinent labs & imaging results that were available during my care of the patient were reviewed by me and considered in my medical decision making (see chart for details).  21 year old male with abdominal cramping, nausea and vomiting this morning. Vital signs are normal. He is currently asymptomatic. Abdomen is soft, benign. Labs are unremarkable. Offered prescription for Zofran if he  becomes nauseous later however he declined. Return precautions given.  Final Clinical Impressions(s) / ED Diagnoses   Final diagnoses:  Periumbilical abdominal pain  Vomiting without nausea, intractability of vomiting not specified, unspecified vomiting type    New Prescriptions New Prescriptions   No medications on file     Beryle QuantGekas, Kelly Marie, PA-C 07/09/16 2021    Loren RacerYelverton, David, MD 07/19/16 [phone]

## 2016-07-09 NOTE — ED Triage Notes (Signed)
PT states he woke up, had a stomach cramp, vomited one time, and felt better.  He just wanted to be seen by an MD to make sure it didn't happen again.

## 2016-09-16 ENCOUNTER — Encounter (HOSPITAL_COMMUNITY): Payer: Self-pay | Admitting: Emergency Medicine

## 2016-09-16 ENCOUNTER — Emergency Department (HOSPITAL_COMMUNITY)
Admission: EM | Admit: 2016-09-16 | Discharge: 2016-09-17 | Disposition: A | Discharge status: Home / Self Care | Payer: Medicaid Other | Attending: Emergency Medicine | Admitting: Emergency Medicine

## 2016-09-16 DIAGNOSIS — R1084 Generalized abdominal pain: Secondary | ICD-10-CM

## 2016-09-16 DIAGNOSIS — R197 Diarrhea, unspecified: Secondary | ICD-10-CM | POA: Insufficient documentation

## 2016-09-16 DIAGNOSIS — Z79899 Other long term (current) drug therapy: Secondary | ICD-10-CM | POA: Insufficient documentation

## 2016-09-16 DIAGNOSIS — F17210 Nicotine dependence, cigarettes, uncomplicated: Secondary | ICD-10-CM | POA: Insufficient documentation

## 2016-09-16 DIAGNOSIS — R112 Nausea with vomiting, unspecified: Secondary | ICD-10-CM

## 2016-09-16 LAB — URINALYSIS, ROUTINE W REFLEX MICROSCOPIC
BILIRUBIN URINE: NEGATIVE
Glucose, UA: NEGATIVE mg/dL
Ketones, ur: NEGATIVE mg/dL
LEUKOCYTES UA: NEGATIVE
Nitrite: NEGATIVE
PROTEIN: NEGATIVE mg/dL
SPECIFIC GRAVITY, URINE: 1.024 (ref 1.005–1.030)
pH: 6 (ref 5.0–8.0)

## 2016-09-16 LAB — COMPREHENSIVE METABOLIC PANEL
ALT: 51 U/L (ref 17–63)
AST: 32 U/L (ref 15–41)
Albumin: 4.6 g/dL (ref 3.5–5.0)
Alkaline Phosphatase: 63 U/L (ref 38–126)
Anion gap: 8 (ref 5–15)
BILIRUBIN TOTAL: 0.3 mg/dL (ref 0.3–1.2)
BUN: 13 mg/dL (ref 6–20)
CALCIUM: 9.7 mg/dL (ref 8.9–10.3)
CO2: 29 mmol/L (ref 22–32)
CREATININE: 0.86 mg/dL (ref 0.61–1.24)
Chloride: 105 mmol/L (ref 101–111)
GFR calc Af Amer: >60 mL/min (ref >60)
Glucose, Bld: 95 mg/dL (ref 65–99)
Potassium: 4.4 mmol/L (ref 3.5–5.1)
Sodium: 142 mmol/L (ref 135–145)
Total Protein: 8.2 g/dL — ABNORMAL HIGH (ref 6.5–8.1)

## 2016-09-16 LAB — CBC
HEMATOCRIT: 45.8 % (ref 39.0–52.0)
Hemoglobin: 15 g/dL (ref 13.0–17.0)
MCH: 27.5 pg (ref 26.0–34.0)
MCHC: 32.8 g/dL (ref 30.0–36.0)
MCV: 84 fL (ref 78.0–100.0)
Platelets: 245 10*3/uL (ref 150–400)
RBC: 5.45 MIL/uL (ref 4.22–5.81)
RDW: 13.2 % (ref 11.5–15.5)
WBC: 5.4 10*3/uL (ref 4.0–10.5)

## 2016-09-16 LAB — LIPASE, BLOOD: Lipase: 24 U/L (ref 11–51)

## 2016-09-16 MED ORDER — ONDANSETRON 4 MG PO TBDP
4.0000 mg | ORAL_TABLET | Freq: Once | ORAL | Status: AC
  Administered 2016-09-16: 4 mg via ORAL
  Filled 2016-09-16: qty 1

## 2016-09-16 MED ORDER — ONDANSETRON HCL 4 MG PO TABS: 4.0000 mg | ORAL_TABLET | Freq: Three times a day (TID) | ORAL | 0 refills | Status: DC | PRN

## 2016-09-16 NOTE — ED Notes (Signed)
Pt able to tolerate PO fluids without emesis. MD made aware.

## 2016-09-16 NOTE — ED Provider Notes (Signed)
[facility]-EMERGENCY DEPT Provider Note   CSN: [id number] Arrival date & time: 09/16/16  2027     History   Chief Complaint Chief Complaint  Patient presents with  . Abdominal Pain    HPI Richard David is a 21 y.o. male.  The history is provided by the patient and medical records.  Emesis   This is a new problem. The current episode started 2 days ago. The problem occurs 2 to 4 times per day. The problem has not changed since onset.The emesis has an appearance of stomach contents. There has been no fever. Associated symptoms include abdominal pain and diarrhea. Pertinent negatives include no chills, no cough, no fever, no sweats and no URI.    History reviewed. No pertinent past medical history.  Patient Active Problem List   Diagnosis Date Noted  . CLOSED FRACTURE OF SHAFT OF TIBIA 12/14/2008    History reviewed. No pertinent surgical history.     Home Medications    Prior to Admission medications   Medication Sig Start Date End Date Taking? Authorizing Provider  ibuprofen (ADVIL,MOTRIN) 800 MG tablet Take 1 tablet (800 mg total) by mouth every 8 (eight) hours as needed for mild pain or moderate pain. 08/05/14   Trixie DredgeWest, Emily, PA-C  levofloxacin (LEVAQUIN) 500 MG tablet Take 1 tablet (500 mg total) by mouth daily. 08/05/14   Trixie DredgeWest, Emily, PA-C  naproxen (NAPROSYN) 500 MG tablet Take 1 tablet (500 mg total) by mouth 2 (two) times daily. 12/15/14   Patel-Mills, Lorelle FormosaHanna, PA-C  ondansetron (ZOFRAN ODT) 8 MG disintegrating tablet Take 1 tablet (8 mg total) by mouth every 8 (eight) hours as needed for nausea or vomiting. 02/17/15   Kirichenko, Lemont Fillersatyana, PA-C  traMADol (ULTRAM) 50 MG tablet Take by mouth every 6 (six) hours as needed for moderate pain.    [provider]    Family History History reviewed. No pertinent family history.  Social History Social History  Substance Use Topics  . Smoking status: Current Every Day Smoker    Packs/day: 0.15    Types: Cigarettes  .  Smokeless tobacco: Never Used  . Alcohol use No     Allergies   Patient has no known allergies.   Review of Systems Review of Systems  Constitutional: Negative for appetite change, chills, diaphoresis, fatigue and fever.  HENT: Negative for congestion and rhinorrhea.   Respiratory: Negative for cough, chest tightness, shortness of breath, wheezing and stridor.   Cardiovascular: Negative for palpitations and leg swelling.  Gastrointestinal: Positive for abdominal pain, diarrhea, nausea and vomiting. Negative for abdominal distention and constipation.  Genitourinary: Negative for dysuria and flank pain.  Musculoskeletal: Negative for back pain, neck pain and neck stiffness.  Skin: Negative for rash and wound.  Neurological: Negative for light-headedness.  Psychiatric/Behavioral: Negative for agitation.  All other systems reviewed and are negative.    Physical Exam Updated Vital Signs BP (!) 131/94 (BP Location: Left Arm)   Pulse 77   Temp 98.1 F (36.7 C) (Oral)   Resp 18   Ht 6\' 2"  (1.88 m)   Wt 68.9 kg (152 lb)   SpO2 100%   BMI 19.52 kg/m   Physical Exam  Constitutional: He appears well-developed and well-nourished. No distress.  HENT:  Head: Normocephalic.  Mouth/Throat: Oropharynx is clear and moist. No oropharyngeal exudate.  Eyes: Pupils are equal, round, and reactive to light. Conjunctivae and EOM are normal.  Neck: Normal range of motion.  Cardiovascular: Normal rate and intact distal pulses.  No murmur heard. Pulmonary/Chest: Effort normal. No stridor. No respiratory distress. He has no wheezes. He exhibits no tenderness.  Abdominal: Soft. He exhibits no distension. There is no tenderness. There is no rebound and no guarding.  Musculoskeletal: He exhibits no tenderness.  Neurological: No sensory deficit. He exhibits normal muscle tone.  Skin: Skin is warm. Capillary refill takes less than 2 seconds. He is not diaphoretic. No erythema. No pallor.  Nursing  note and vitals reviewed.    ED Treatments / Results  Labs (all labs ordered are listed, but only abnormal results are displayed) Labs Reviewed  COMPREHENSIVE METABOLIC PANEL - Abnormal; Notable for the following:       Result Value   Total Protein 8.2 (*)    All other components within normal limits  URINALYSIS, ROUTINE W REFLEX MICROSCOPIC - Abnormal; Notable for the following:    Hgb urine dipstick SMALL (*)    Bacteria, UA RARE (*)    Squamous Epithelial / LPF 0-5 (*)    All other components within normal limits  LIPASE, BLOOD  CBC    EKG  EKG Interpretation None       Radiology No results found.  Procedures Procedures (including critical care time)  Medications Ordered in ED Medications  ondansetron (ZOFRAN-ODT) disintegrating tablet 4 mg (4 mg Oral Given 09/16/16 2212)     Initial Impression / Assessment and Plan / ED Course  I have reviewed the triage vital signs and the nursing notes.  Pertinent labs & imaging results that were available during my care of the patient were reviewed by me and considered in my medical decision making (see chart for details).     Richard David is a 21 y.o. male with no significant past medical history who presents with nausea, vomiting, diarrhea, and abdominal pain. Patient says that he works at [employer] and interacts with numerous people. He says that 2 days ago, he began having symptoms of mild abdominal aching. He says this has progressed to a more moderate abdominal cramping. He says he also had nausea and nonbloody nonbilious emesis. He says that this happened numerous times. He also reports several episodes of nonbloody diarrhea. He didn't notsick contacts. He denies taking any medicine to help her symptoms. He denies fevers, chills, chest pain, shortness of breath, or change in urination. He denies any past medical history.   On exam, abdomen is nontender. Lungs are clear. No focal neurologic deficits. Patient has  normal heart rate and blood pressure is normal.  Based on reassuring exam, patient will have screening laboratory testing to look for concerning causes of abdominal symptoms. Patient given nausea medication and encouraged to try oral hydration and food.   Laboratory testing results in above. Patient had no leukocytosis and no other significant abnormality is on workup. No evidence of UTI.  Given his reassuring abdominal exam and reassuring labs, do not feel patient has appendicitis. Given his exposure to numerous people at [employer], suspect viral gastroenteritis causing his symptoms. Patient was able to tolerate food and fluids after medications. Given his ability to maintain hydration and his improvement in symptoms with Zofran, feel patient is safe for discharge. Patient will be given work note so that he does not spread likely viral gastroenteritis to the patient returns of [employer]. Patient understood follow up instructions and return precautions. Patient had no other questions or concerns and was discharged in good condition.       Final Clinical Impressions(s) / ED Diagnoses  Final diagnoses:  Nausea vomiting and diarrhea  Generalized abdominal pain    New Prescriptions Discharge Medication List as of 09/16/2016 11:56 PM    START taking these medications   Details  ondansetron (ZOFRAN) 4 MG tablet Take 1 tablet (4 mg total) by mouth every 8 (eight) hours as needed for nausea or vomiting., Starting Sun 09/16/2016, Print        Clinical Impression: 1. Nausea vomiting and diarrhea   2. Generalized abdominal pain     Disposition: Discharge  Condition: Good  I have discussed the results, Dx and Tx plan with the pt(& family if present). He/she/they expressed understanding and agree(s) with the plan. Discharge instructions discussed at great length. Strict return precautions discussed and pt &/or family have verbalized understanding of the instructions. No further questions at  time of discharge.    New Prescriptions   ONDANSETRON (ZOFRAN) 4 MG TABLET    Take 1 tablet (4 mg total) by mouth every 8 (eight) hours as needed for nausea or vomiting.    Follow Up: Gareth Morgan, MD [address] [place] [address] [phone]  Schedule an appointment as soon as possible for a visit    [facility]ENC[facility] DEPT [address] [id number] mc [place] [address] [phone]  If symptoms worsen     Tegeler, Canary Brim, MD 09/17/16 [phone]

## 2016-09-16 NOTE — Discharge Instructions (Signed)
Please use the nausea medicine to help with your vomiting. Please stay hydrated. We suspect to have a viral GI bug. Please follow-up with your primary care physician for further management. If any symptoms change or worsen, please return to the nearest emergency room.

## 2016-09-16 NOTE — ED Triage Notes (Signed)
Pt reports having abd pain for the last 3 days and has reported vomiting and diarrhea.

## 2016-11-10 ENCOUNTER — Emergency Department (HOSPITAL_COMMUNITY)
Admission: EM | Admit: 2016-11-10 | Discharge: 2016-11-10 | Disposition: A | Discharge status: Home / Self Care | Payer: Self-pay | Attending: Emergency Medicine | Admitting: Emergency Medicine

## 2016-11-10 ENCOUNTER — Encounter (HOSPITAL_COMMUNITY): Payer: Self-pay | Admitting: Emergency Medicine

## 2016-11-10 DIAGNOSIS — F17210 Nicotine dependence, cigarettes, uncomplicated: Secondary | ICD-10-CM | POA: Insufficient documentation

## 2016-11-10 DIAGNOSIS — A549 Gonococcal infection, unspecified: Secondary | ICD-10-CM | POA: Insufficient documentation

## 2016-11-10 DIAGNOSIS — Z7251 High risk heterosexual behavior: Secondary | ICD-10-CM | POA: Insufficient documentation

## 2016-11-10 DIAGNOSIS — A64 Unspecified sexually transmitted disease: Secondary | ICD-10-CM

## 2016-11-10 DIAGNOSIS — A749 Chlamydial infection, unspecified: Secondary | ICD-10-CM | POA: Insufficient documentation

## 2016-11-10 LAB — URINALYSIS, ROUTINE W REFLEX MICROSCOPIC
BACTERIA UA: NONE SEEN
BILIRUBIN URINE: NEGATIVE
GLUCOSE, UA: NEGATIVE mg/dL
HGB URINE DIPSTICK: NEGATIVE
Ketones, ur: NEGATIVE mg/dL
NITRITE: NEGATIVE
Protein, ur: NEGATIVE mg/dL
SPECIFIC GRAVITY, URINE: 1.025 (ref 1.005–1.030)
pH: 6 (ref 5.0–8.0)

## 2016-11-10 NOTE — ED Provider Notes (Signed)
[facility]-EMERGENCY DEPT Provider Note   CSN: [id number] Arrival date & time: 11/10/16  1015     History   Chief Complaint Chief Complaint  Patient presents with  . Dysuria    HPI Richard David is a 21 y.o. male.Presents emergency Department with chief complaint of dysuria and penile discharge. Onset yesterday. Patient states that he had protected sexual intercourse one week ago. He is unable to think of the last time he had unprotected sexual intercourse. She has sex with both men and women. He denies previous history of STD.  HPI  History reviewed. No pertinent past medical history.  Patient Active Problem List   Diagnosis Date Noted  . CLOSED FRACTURE OF SHAFT OF TIBIA 12/14/2008    History reviewed. No pertinent surgical history.     Home Medications    Prior to Admission medications   Medication Sig Start Date End Date Taking? Authorizing Provider  ibuprofen (ADVIL,MOTRIN) 800 MG tablet Take 1 tablet (800 mg total) by mouth every 8 (eight) hours as needed for mild pain or moderate pain. Patient not taking: Reported on 09/16/2016 08/05/14   Trixie Dredge, PA-C  levofloxacin (LEVAQUIN) 500 MG tablet Take 1 tablet (500 mg total) by mouth daily. Patient not taking: Reported on 09/16/2016 08/05/14   Trixie Dredge, PA-C  naproxen (NAPROSYN) 500 MG tablet Take 1 tablet (500 mg total) by mouth 2 (two) times daily. Patient not taking: Reported on 09/16/2016 12/15/14   Patel-Mills, Lorelle Formosa, PA-C  ondansetron (ZOFRAN ODT) 8 MG disintegrating tablet Take 1 tablet (8 mg total) by mouth every 8 (eight) hours as needed for nausea or vomiting. Patient not taking: Reported on 09/16/2016 02/17/15   Jaynie Crumble, PA-C  ondansetron (ZOFRAN) 4 MG tablet Take 1 tablet (4 mg total) by mouth every 8 (eight) hours as needed for nausea or vomiting. 09/16/16   Tegeler, Canary Brim, MD    Family History No family history on file.  Social History Social History  Substance Use Topics  . Smoking status:  Current Every Day Smoker    Packs/day: 0.15    Types: Cigarettes  . Smokeless tobacco: Current User  . Alcohol use No     Allergies   Patient has no known allergies.   Review of Systems Review of Systems  Ten systems reviewed and are negative for acute change, except as noted in the HPI.   Physical Exam Updated Vital Signs BP 119/72 (BP Location: Right Arm)   Pulse 74   Temp 97.9 F (36.6 C) (Oral)   Resp 16   Ht  (1.88 m)   Wt 72.1 kg (159 lb)   SpO2 100%   BMI 20.41 kg/m   Physical Exam  Constitutional: He appears well-developed and well-nourished. No distress.  HENT:  Head: Normocephalic and atraumatic.  Eyes: Conjunctivae are normal. No scleral icterus.  Neck: Normal range of motion. Neck supple.  Cardiovascular: Normal rate, regular rhythm and normal heart sounds.   Pulmonary/Chest: Effort normal and breath sounds normal. No respiratory distress.  Abdominal: Soft. There is no tenderness.  Genitourinary:  Genitourinary Comments: Normal male genitalia, circumcised, active yellow discharge from urethral meatus, no lesions, no testicular pain.  Musculoskeletal: He exhibits no edema.  Neurological: He is alert.  Skin: Skin is warm and dry. He is not diaphoretic.  Psychiatric: His behavior is normal.  Nursing note and vitals reviewed.    ED Treatments / Results  Labs (all labs ordered are listed, but only abnormal results are displayed) Labs Reviewed  URINALYSIS, ROUTINE W REFLEX MICROSCOPIC - Abnormal; Notable for the following:       Result Value   APPearance HAZY (*)    Leukocytes, UA LARGE (*)    Squamous Epithelial / LPF 0-5 (*)    All other components within normal limits  RPR  HIV ANTIBODY (ROUTINE TESTING)  GC/CHLAMYDIA PROBE AMP ([place]) NOT AT [facility]    EKG  EKG Interpretation None       Radiology No results found.  Procedures Procedures (including critical care time)  Medications Ordered in ED Medications - No data to  display   Initial Impression / Assessment and Plan / ED Course  I have reviewed the triage vital signs and the nursing notes.  Pertinent labs & imaging results that were available during my care of the patient were reviewed by me and considered in my medical decision making (see chart for details).     Discussed demographic risks for men at this patient's age and contracting HIV. Patient will be discharged with a number for the infectious disease clinic to follow-up for prep therapy. We discussed what prep therapy is. Patient will be treated today for gonorrhea and chlamydia. His syphilis and HIV tests are pending. I discussed safe sex precautions with the patient.  Final Clinical Impressions(s) / ED Diagnoses   Final diagnoses:  STD (male)  High-risk sexual behavior    New Prescriptions New Prescriptions   No medications on file     Arthor Captain, PA-C 11/10/16 1249    Charlynne Pander, MD 11/10/16 [phone]

## 2016-11-10 NOTE — ED Triage Notes (Signed)
Pt. Stated, I got pain when I pee started yesteday, And it looks like yellow stuff oozing.

## 2016-11-10 NOTE — Discharge Instructions (Signed)
You have been treated in the emergency department for an infection, possibly sexually transmitted. Results of your gonorrhea and chlamydia tests are pending and you will be notified if they are positive. It is very important to practice safe sex and use condoms when sexually active. If your results are positive you need to notify all sexual partners so they can be treated as well. The website http://www.dontspreadit.com/ can be used to send anonymous text messages or emails to alert sexual contacts. Follow up with your doctor, or OBGYN in regards to today's visit.   ° °Gonorrhea and Chlamydia °SYMPTOMS  °In females, symptoms may go unnoticed. Symptoms that are more noticeable can include:  °Belly (abdominal) pain.  °Painful intercourse.  °Watery mucous-like discharge from the vagina.  °Miscarriage.  °Discomfort when urinating.  °Inflammation of the rectum.  °Abnormal gray-green frothy vaginal discharge  °Vaginal itching and irritatio  °Itching and irritation of the area outside the vagina.   °Painful urination.  °Bleeding after sexual intercourse.  °In males, symptoms include:  °Burning with urination.  °Pain in the testicles.  °Watery mucous-like discharge from the penis.  °It can cause longstanding (chronic) pelvic pain after frequent infections.  °TREATMENT  °PID can cause women to not be able to have children (sterile) if left untreated or if half-treated.  It is important to finish ALL medications given to you.  °This is a sexually transmitted infection. So you are also at risk for other sexually transmitted diseases, including HIV (AIDS), it is recommended that you get tested. °HOME CARE INSTRUCTIONS  °Warning: This infection is contagious. Do not have sex until treatment is completed. Follow up at your caregiver's office or the clinic to which you were referred. If your diagnosis (learning what is wrong) is confirmed by culture or some other method, your recent sexual contacts need treatment. Even if they are  symptom free or have a negative culture or evaluation, they should be treated.  °PREVENTION  °Women should use sanitary pads instead of tampons for vaginal discharge.  °Wipe front to back after using the toilet and avoid douching.   °Practice safe sex, use condoms, have only one sex partner and be sure your sex partner is not having sex with others.  °Ask your caregiver to test you for chlamydia at your regular checkups or sooner if you are having symptoms.  °Ask for further information if you are pregnant.  °SEEK IMMEDIATE MEDICAL CARE IF:  °You develop an oral temperature above 102° F (38.9° C), not controlled by medications or lasting more than 2 days.  °You develop an increase in pain.  °You develop any type of abnormal discharge.  °You develop vaginal bleeding and it is not time for your period.  °You develop painful intercourse.  ° °Bacterial Vaginosis  °Bacterial vaginosis (BV) is a vaginal infection where the normal balance of bacteria in the vagina is disrupted. This is not a sexually transmitted disease and your sexual partners do NOT need to be treated. °CAUSES  °The cause of BV is not fully understood. BV develops when there is an increase or imbalance of harmful bacteria.  °Some activities or behaviors can upset the normal balance of bacteria in the vagina and put women at increased risk including:  °Having a new sex partner or multiple sex partners.  °Douching.  °Using an intrauterine device (IUD) for contraception.  °It is not clear what role sexual activity plays in the development of BV. However, women that have never had sexual intercourse are rarely   infected with BV.  °Women do not get BV from toilet seats, bedding, swimming pools or from touching objects around them.  ° °SYMPTOMS  °Grey vaginal discharge.  °A fish-like odor with discharge, especially after sexual intercourse.  °Itching or burning of the vagina and vulva.  °Burning or pain with urination.  °Some women have no signs or symptoms at  all.  ° °TREATMENT  °Sometimes BV will clear up without treatment.  °BV may be treated with antibiotics.  °BV can recur after treatment. If this happens, a second round of antibiotics will often be prescribed.  °HOME CARE INSTRUCTIONS  °Finish all medication as directed by your caregiver.  °Do not have sex until treatment is completed.  °Do NOT drink any alcoholic beverages while being treated  with Metronidazole (Flagyl). This will cause a severe reaction inducing vomiting. ° °RESOURCE GUIDE ° °Dental Problems ° °Patients with Medicaid: °[facility]                     [facility] °[address]                                           [address] °Phone:  [phone]                                                  Phone:  [phone] ° °If unable to pay or uninsured, contact:  [facility] or [facility] to become qualified for the adult dental clinic. ° °Chronic Pain Problems °Contact [facility]  [phone] °Patients need to be referred by their primary care doctor. ° °Insufficient Money for Medicine °Contact [employer]:  call "211" or [facility] 271-5999. ° °No Primary Care Doctor °Call [facility]  832-8000 °Other agencies that provide inexpensive medical care °   [facility]  832-8035 °    Internal Medicine  832-7272 °   [facility]  271-5999 °   Women's Clinic  832-4777 °   [facility]  373-0678 °   [facility]  [phone] ° °Psychological Services °[facility]  [phone] °[facility]  [phone] °[facility]   [phone] (emergency services 641-4993) ° °Substance Abuse Resources °[facility]  336-882-2125 °[facility] 336-784-9470 °[facility] 336-285-9073 °[facility] 336-845-3988 °Residential & Outpatient Substance Abuse Program  800-659-3381 ° °Abuse/Neglect °[place] Child Abuse Hotline (336)  641-3795 °[place] Child Abuse Hotline 800-378-5315 (After Hours) ° °Emergency Shelter °[facility] (336) 271-5985 ° °Maternity Homes °[facility] (336) 275-9566 °[facility] (704) 372-4663 ° °MRSA Hotline #:   832-7006 ° ° ° °[place] Resources ° °[facility] [place]     [employer]                          [facility] °[address]                       [address]      [address]  °  [place]al Phone:  409-8119                                   Phone:  [phone]                 Phone:  (720)565-2439  [facility] Phone:  681-751-4573  [facility] Child Abuse Hotline 908-859-9226 254-435-1859 (After Hours)  Free HIV and STD Testing These locations offer FREE confidential testing for HIV, Chlamydia, Gonorrhea, and Syphilis. Non-Traditional Testing Sites Address Telephone  [facility] [address] [place] [phone] Mondays 5pm - 7pm  [facility] [address] [place] [phone] Wednesdays 2pm-8pm  [employer] and [facility] [address] [place] [phone] Thursdays 9am-12noon 1pm-4pm  [facility] [address], [place] [phone] Tuesdays Thursdays 9am-12noon 1pm-4pm  [facility] Department of [employer] offers free, confidential testing and treatment for HIV, Chlamydia, Gonorrhea, Syphilis, Herpes, Bacterial Vaginosis, Yeast, and Trichomoniasis. Traditional Testing   [facility] Department-[place] [facility] [address] [place] [phone]  Monday thru Friday  Call for an appointment  [facility] Department- [facility] STD Clinic [address] [place] [phone] Monday thru Friday  Call for anappointment.  If you have any questions about this information please call [phone]. 12/21/2010

## 2016-11-10 NOTE — ED Notes (Signed)
Declined W/C at D/C and was escorted to lobby by RN. 

## 2016-11-11 LAB — RPR: RPR: NONREACTIVE

## 2016-11-11 LAB — HIV ANTIBODY (ROUTINE TESTING W REFLEX): HIV SCREEN 4TH GENERATION: NONREACTIVE

## 2016-11-12 LAB — GC/CHLAMYDIA PROBE AMP (~~LOC~~) NOT AT ARMC
CHLAMYDIA, DNA PROBE: NEGATIVE
NEISSERIA GONORRHEA: POSITIVE — AB

## 2017-10-16 ENCOUNTER — Encounter (HOSPITAL_COMMUNITY): Payer: Self-pay | Admitting: Emergency Medicine

## 2017-10-16 ENCOUNTER — Emergency Department (HOSPITAL_COMMUNITY): Payer: Medicaid Other

## 2017-10-16 ENCOUNTER — Emergency Department (HOSPITAL_COMMUNITY)
Admission: EM | Admit: 2017-10-16 | Discharge: 2017-10-16 | Disposition: A | Discharge status: Home / Self Care | Payer: Medicaid Other | Attending: Emergency Medicine | Admitting: Emergency Medicine

## 2017-10-16 ENCOUNTER — Other Ambulatory Visit: Payer: Self-pay

## 2017-10-16 DIAGNOSIS — Y92410 Unspecified street and highway as the place of occurrence of the external cause: Secondary | ICD-10-CM | POA: Insufficient documentation

## 2017-10-16 DIAGNOSIS — F17210 Nicotine dependence, cigarettes, uncomplicated: Secondary | ICD-10-CM | POA: Insufficient documentation

## 2017-10-16 DIAGNOSIS — Y998 Other external cause status: Secondary | ICD-10-CM | POA: Insufficient documentation

## 2017-10-16 DIAGNOSIS — M7918 Myalgia, other site: Secondary | ICD-10-CM | POA: Insufficient documentation

## 2017-10-16 DIAGNOSIS — Y9389 Activity, other specified: Secondary | ICD-10-CM | POA: Insufficient documentation
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
## 2017-10-16 NOTE — Discharge Instructions (Addendum)
Take ibuprofen 3 times a day with meals. Take 800 mg (4 pills) at a time.  Do not take other anti-inflammatories at the same time (Advil, Motrin, naproxen, Aleve). You may supplement with Tylenol if you need further pain control. Use ice packs or heating pads if this helps control your pain. You will likely have continued muscle stiffness and soreness over the next couple days.  Follow-up with urgent care in 1 week if your symptoms are not improving. Follow up with urgent care if your ankle becomes very pain, swollen, or you are unable to bear weight.  Return to the emergency room if you develop vision changes, vomiting, slurred speech, numbness, loss of bowel or bladder control, or any new or worsening symptoms.
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
## 2017-10-19 ENCOUNTER — Encounter (HOSPITAL_COMMUNITY): Payer: Self-pay

## 2017-10-19 ENCOUNTER — Emergency Department (HOSPITAL_COMMUNITY)
Admission: EM | Admit: 2017-10-19 | Discharge: 2017-10-19 | Disposition: A | Discharge status: Home / Self Care | Payer: Medicaid Other | Attending: Emergency Medicine | Admitting: Emergency Medicine

## 2017-10-19 ENCOUNTER — Emergency Department (HOSPITAL_COMMUNITY): Payer: Medicaid Other

## 2017-10-19 ENCOUNTER — Other Ambulatory Visit: Payer: Self-pay

## 2017-10-19 DIAGNOSIS — S161XXA Strain of muscle, fascia and tendon at neck level, initial encounter: Secondary | ICD-10-CM

## 2017-10-19 DIAGNOSIS — Y929 Unspecified place or not applicable: Secondary | ICD-10-CM | POA: Insufficient documentation

## 2017-10-19 DIAGNOSIS — Y999 Unspecified external cause status: Secondary | ICD-10-CM | POA: Insufficient documentation

## 2017-10-19 DIAGNOSIS — M25562 Pain in left knee: Secondary | ICD-10-CM

## 2017-10-19 DIAGNOSIS — F17210 Nicotine dependence, cigarettes, uncomplicated: Secondary | ICD-10-CM | POA: Insufficient documentation

## 2017-10-19 DIAGNOSIS — Y9389 Activity, other specified: Secondary | ICD-10-CM | POA: Insufficient documentation

## 2017-10-19 MED ORDER — CYCLOBENZAPRINE HCL 10 MG PO TABS: 10.0000 mg | ORAL_TABLET | Freq: Two times a day (BID) | ORAL | 0 refills | Status: DC | PRN

## 2017-10-19 NOTE — ED Notes (Signed)
Patient transported to X-ray 
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
## 2017-10-19 NOTE — Discharge Instructions (Signed)
Thank you for allowing me to care for you today in the Emergency Department.   The x-ray of your neck was negative for any fractures/broken bones.  Your exam is concerning for muscle injury.  To treat your symptoms at home, take 600 mg of ibuprofen with food or 650 mg of Tylenol every 6 hours for pain control.  Apply ice for 15 to 20 minutes up to 3-4 times a day.  Start to stretch the muscles of your neck as your pain allows.  I have included some exercises that can help with your pain.  Flexeril as a muscle relaxer he can be taken up to 2 times daily to help with muscle pain and spasms.  You can make you drowsy so do not take it if you have to work or drive.  Do not drink alcohol while taking this medication.  For your knee, the treatment is similar.  You can wear the knee sleeve to help with compression and pain control.  Can also take Tylenol and ibuprofen.  In addition to ice, ibuprofen and Tylenol, and wearing the knee sleeve, elevate your left leg so your toes are above the level of your nose to help with pain and swelling.  If your knee pain does not start to improve in the next week, you can call Dr. Jena Gauss with orthopedics to schedule follow-up appointment.  Reasons to return to the emergency department include if you have a new fall or injury.  If you pass out, if you have double vision, chest pain, or new numbness or weakness in the leg or arm that does not improve.
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
## 2017-10-19 NOTE — ED Notes (Signed)
Pt verbalizes understanding of d/c instructions. Prescriptions reviewed with patient. Pt ambulatory at d/c with all belongings and with family.   

## 2018-01-20 ENCOUNTER — Encounter (HOSPITAL_COMMUNITY): Payer: Self-pay

## 2018-01-20 ENCOUNTER — Emergency Department (HOSPITAL_COMMUNITY)
Admission: EM | Admit: 2018-01-20 | Discharge: 2018-01-20 | Disposition: A | Discharge status: Home / Self Care | Payer: Medicaid Other | Attending: Emergency Medicine | Admitting: Emergency Medicine

## 2018-01-20 ENCOUNTER — Other Ambulatory Visit: Payer: Self-pay

## 2018-01-20 DIAGNOSIS — M79644 Pain in right finger(s): Secondary | ICD-10-CM

## 2018-01-20 DIAGNOSIS — L02511 Cutaneous abscess of right hand: Secondary | ICD-10-CM | POA: Insufficient documentation

## 2018-01-20 DIAGNOSIS — F17210 Nicotine dependence, cigarettes, uncomplicated: Secondary | ICD-10-CM | POA: Insufficient documentation

## 2018-01-20 MED ORDER — IBUPROFEN 800 MG PO TABS
800.0000 mg | ORAL_TABLET | Freq: Once | ORAL | Status: AC
  Administered 2018-01-20: 800 mg via ORAL
  Filled 2018-01-20: qty 1

## 2018-01-20 MED ORDER — NAPROXEN 375 MG PO TABS: 375.0000 mg | ORAL_TABLET | Freq: Two times a day (BID) | ORAL | 0 refills | Status: DC

## 2018-01-20 MED ORDER — LIDOCAINE HCL (PF) 1 % IJ SOLN
2.0000 mL | Freq: Once | INTRAMUSCULAR | Status: AC
  Administered 2018-01-20: 2 mL

## 2018-01-20 MED ORDER — CEPHALEXIN 500 MG PO CAPS: 500.0000 mg | ORAL_CAPSULE | Freq: Four times a day (QID) | ORAL | 0 refills | Status: AC

## 2018-01-20 MED ORDER — LIDOCAINE HCL (PF) 1 % IJ SOLN
INTRAMUSCULAR | Status: AC
  Administered 2018-01-20: 2 mL
  Filled 2018-01-20: qty 5

## 2018-01-20 NOTE — ED Provider Notes (Signed)
[facility] EMERGENCY DEPARTMENT Provider Note   CSN: [id number] Arrival date & time: 01/20/18  2118     History   Chief Complaint Chief Complaint  Patient presents with  . Hand Pain    HPI Richard David is a 22 y.o. male presenting with right hand pain onset 3 days ago. Patient is right handed. Patient reports pain is intermittent and only occurs with bending of fingers. Patient localizes pain in the PIP of 3rd digit on the palmar aspect of the right hand. Patient reports associated edema and erythema on the finger. Patient states he has tried vaseline without relief. Patient denies taking anything for the pain. Patient denies trauma, insect bites, fever, chills, night sweats, abdominal pain, nausea, or vomiting. Patient reports occasional marijuana use, but denies IVDU.   HPI  History reviewed. No pertinent past medical history.  Patient Active Problem List   Diagnosis Date Noted  . CLOSED FRACTURE OF SHAFT OF TIBIA 12/14/2008    History reviewed. No pertinent surgical history.      Home Medications    Prior to Admission medications   Medication Sig Start Date End Date Taking? Authorizing Provider  cephALEXin (KEFLEX) 500 MG capsule Take 1 capsule (500 mg total) by mouth 4 (four) times daily for 7 days. 01/20/18 01/27/18  Carlyle Basques P, PA-C  cyclobenzaprine (FLEXERIL) 10 MG tablet Take 1 tablet (10 mg total) by mouth 2 (two) times daily as needed for muscle spasms. 10/19/17   McDonald, Mia A, PA-C  naproxen (NAPROSYN) 375 MG tablet Take 1 tablet (375 mg total) by mouth 2 (two) times daily. 01/20/18   Carlyle Basques P, PA-C  ondansetron (ZOFRAN) 4 MG tablet Take 1 tablet (4 mg total) by mouth every 8 (eight) hours as needed for nausea or vomiting. 09/16/16   Tegeler, Canary Brim, MD    Family History No family history on file.  Social History Social History   Tobacco Use  . Smoking status: Current Every Day Smoker    Packs/day: 0.15    Types:  Cigarettes  . Smokeless tobacco: Current User  Substance Use Topics  . Alcohol use: No  . Drug use: No     Allergies   Patient has no known allergies.   Review of Systems Review of Systems  Constitutional: Negative for chills, diaphoresis and fever.  Respiratory: Negative for cough and shortness of breath.   Cardiovascular: Negative for chest pain.  Gastrointestinal: Negative for abdominal pain, nausea and vomiting.  Endocrine: Negative for cold intolerance and heat intolerance.  Musculoskeletal: Negative for back pain.  Skin: Positive for color change. Negative for rash.       Pt reports erythema and edema on 3rd digit of right hand.   Allergic/Immunologic: Negative for immunocompromised state.  Hematological: Negative for adenopathy.  Psychiatric/Behavioral: The patient is not nervous/anxious.      Physical Exam Updated Vital Signs BP 119/70 (BP Location: Right Arm)   Pulse 79   Temp 98.5 F (36.9 C) (Oral)   Resp 12   Ht 6\' 2"  (1.88 m)   Wt 68 kg   SpO2 99%   BMI 19.26 kg/m   Physical Exam  Constitutional: He is oriented to person, place, and time. He appears well-developed and well-nourished. No distress.  HENT:  Head: Normocephalic and atraumatic.  Neck: Normal range of motion.  Cardiovascular: Normal rate, regular rhythm and normal heart sounds. Exam reveals no gallop and no friction rub.  No murmur heard. Pulmonary/Chest: Effort normal and breath  sounds normal. No respiratory distress. He has no wheezes. He has no rales.  Abdominal: Soft. There is no tenderness.  Musculoskeletal: Normal range of motion.       Right wrist: Normal. He exhibits normal range of motion, no tenderness and no bony tenderness.       Arms: Neurological: He is alert and oriented to person, place, and time.  Skin: Skin is warm. Capillary refill takes less than 2 seconds. No rash noted. He is not diaphoretic. There is erythema.  Psychiatric: He has a normal mood and affect.  Nursing  note and vitals reviewed.    ED Treatments / Results  Labs (all labs ordered are listed, but only abnormal results are displayed) Labs Reviewed - No data to display  EKG None  Radiology No results found.  Procedures .Marland Kitchen.Incision and Drainage Date/Time: 01/20/2018 11:01 PM Performed by: Leretha DykesHernandez, Steele Ledonne P, PA-C Authorized by: Leretha DykesHernandez, Maryna Yeagle P, PA-C   Consent:    Consent obtained:  Verbal   Consent given by:  Patient   Risks discussed:  Bleeding, infection, incomplete drainage and pain   Alternatives discussed:  No treatment Pre-procedure details:    Skin preparation:  Betadine Anesthesia (see MAR for exact dosages):    Anesthesia method:  Local infiltration   Local anesthetic:  Lidocaine 1% w/o epi Procedure type:    Complexity:  Simple Procedure details:    Needle aspiration: no     Incision types:  Stab incision   Incision depth:  Dermal   Scalpel blade:  11   Wound management:  Irrigated with saline   Drainage:  Bloody   Drainage amount:  Scant   Wound treatment:  Wound left open   Packing materials:  None Post-procedure details:    Patient tolerance of procedure:  Tolerated well, no immediate complications   (including critical care time)  Medications Ordered in ED Medications  ibuprofen (ADVIL,MOTRIN) tablet 800 mg (800 mg Oral Given 01/20/18 2215)  lidocaine (PF) (XYLOCAINE) 1 % injection 2 mL (2 mLs Infiltration Given 01/20/18 2245)     Initial Impression / Assessment and Plan / ED Course  I have reviewed the triage vital signs and the nursing notes.  Pertinent labs & imaging results that were available during my care of the patient were reviewed by me and considered in my medical decision making (see chart for details).    Patient presents with complaint of finger pain. Patient nontoxic appearing, in no apparent distress, vitals WNL, stable.   Suspect symptoms are likely due to a small abscess over palmar aspect of 3rd digit of right hand. Performed I&D  of the abscess without any complications. Will prescribe keflex for infection. Doubt need for further emergent work up at this time. I discussed results, treatment plan, need for PCP follow-up, and return precautions to return to the ER including for any other new or worsening symptoms with the patient. Provided opportunity for questions, patient confirmed understanding and is in agreement with plan. I have answered their questions. Discharge instructions concerning home care and prescriptions have been given. The patient is STABLE and is discharged to home in good condition. Encouraged patient to follow up with PCP and have PCP obtain results of this visit in 3 or sooner if needed.    Final Clinical Impressions(s) / ED Diagnoses   Final diagnoses:  Finger pain, right    ED Discharge Orders         Ordered    naproxen (NAPROSYN) 375 MG tablet  2  times daily     01/20/18 2308    cephALEXin (KEFLEX) 500 MG capsule  4 times daily     01/20/18 2308           Leretha Dykes, [place] 01/20/18 2312    Vanetta Mulders, MD 01/21/18 0001

## 2018-01-20 NOTE — ED Triage Notes (Signed)
Pt endorses pain to right hand middle finger x3 days. Yellow circle with redness/swelling surrounding. Pain is 1/10 at rest. Pt denies fevers at home. Pt denies injury.

## 2018-01-20 NOTE — Discharge Instructions (Addendum)
You have been seen today for finger pain. Please read and follow all provided instructions.   1. Medications: naproxen for pain, keflex (antibiotic), usual home medications 2. Treatment: rest, drink plenty of fluids 3. Follow Up: Please follow up with your primary doctor in 3 days for discussion of your diagnoses and further evaluation after today's visit; if you do not have a primary care doctor use the resource guide provided to find one; Please return to the ER for any new or worsening symptoms. Please obtain all of your results from medical records or have your doctors office obtain the results - share them with your doctor - you should be seen at your doctors office. Call today to arrange your follow up.   Take medications as prescribed. Please review all of the medicines and only take them if you do not have an allergy to them. Return to the emergency room for worsening condition or new concerning symptoms. Follow up with your regular doctor. If you don't have a regular doctor use one of the numbers below to establish a primary care doctor.  Please be aware that if you are taking birth control pills, taking other prescriptions, ESPECIALLY ANTIBIOTICS may make the birth control ineffective - if this is the case, either do not engage in sexual activity or use alternative methods of birth control such as condoms until you have finished the medicine and your family doctor says it is OK to restart them. If you are on a blood thinner such as COUMADIN, be aware that any other medicine that you take may cause the coumadin to either work too much, or not enough - you should have your coumadin level rechecked in next 7 days if this is the case.  ?  It is also a possibility that you have an allergic reaction to any of the medicines that you have been prescribed - Everybody reacts differently to medications and while MOST people have no trouble with most medicines, you may have a reaction such as nausea,  vomiting, rash, swelling, shortness of breath. If this is the case, please stop taking the medicine immediately and contact your physician.  ?  You should return to the ER if you develop severe or worsening symptoms.   Emergency Department Resource Guide 1) Find a Doctor and Pay Out of Pocket Although you won't have to find out who is covered by your insurance plan, it is a good idea to ask around and get recommendations. You will then need to call the office and see if the doctor you have chosen will accept you as a new patient and what types of options they offer for patients who are self-pay. Some doctors offer discounts or will set up payment plans for their patients who do not have insurance, but you will need to ask so you aren't surprised when you get to your appointment.  2) Contact Your Local Health Department Not all health departments have doctors that can see patients for sick visits, but many do, so it is worth a call to see if yours does. If you don't know where your local health department is, you can check in your phone book. The CDC also has a tool to help you locate your state's health department, and many state websites also have listings of all of their local health departments.  3) Find a Walk-in Clinic If your illness is not likely to be very severe or complicated, you may want to try a walk in clinic. These  are popping up all over the country in pharmacies, drugstores, and shopping centers. They're usually staffed by nurse practitioners or physician assistants that have been trained to treat common illnesses and complaints. They're usually fairly quick and inexpensive. However, if you have serious medical issues or chronic medical problems, these are probably not your best option.  No Primary Care Doctor: Call Health Connect at  [phone] - they can help you locate a primary care doctor that  accepts your insurance, provides certain services, etc. Physician Referral [phone]  Emergency Department Resource Guide 1) Find a Doctor and Pay Out of Pocket Although you won't have to find out who is covered by your insurance plan, it is a good idea to ask around and get recommendations. You will then need to call the office and see if the doctor you have chosen will accept you as a new patient and what types of options they offer for patients who are self-pay. Some doctors offer discounts or will set up payment plans for their patients who do not have insurance, but you will need to ask so you aren't surprised when you get to your appointment.  2) Contact Your Local Health Department Not all health departments have doctors that can see patients for sick visits, but many do, so it is worth a call to see if yours does. If you don't know where your local health department is, you can check in your phone book. The CDC also has a tool to help you locate your state's health department, and many state websites also have listings of all of their local health departments.  3) Find a [facility] If your illness is not likely to be very severe or complicated, you may want to try a walk in clinic. These are popping up all over the country in pharmacies, drugstores, and shopping centers. They're usually staffed by nurse practitioners or physician assistants that have been trained to treat common illnesses and complaints. They're usually fairly quick and inexpensive. However, if you have serious medical issues or chronic medical problems, these are probably not your best option.  No Primary Care Doctor: Call Health Connect at  [phone] - they can help you locate a primary care doctor that  accepts your insurance, provides certain services, etc. Physician Referral Service- [phone]  Chronic Pain Problems: Organization         Address  Phone   Notes  [facility]  [phone] Patients need to be referred by their primary care doctor.    Medication Assistance: Organization         Address  Phone   Notes  [facility] Medication [facility] [address] [place], [place] [address] [phone] --Must be a resident of [facility] -- Must have NO insurance coverage whatsoever (no Medicaid/ Medicare, etc.) -- The pt. MUST have a primary care doctor that directs their care regularly and follows them in the community   [facility]  [phone]   [employer]  [phone]    Agencies that provide inexpensive medical care: Organization         Address  Phone   Notes  [place]  [phone]   [facility]    [phone]   [facility] [place],  [phone]   [place] [address] [place] [phone]   [facility]anned Parenthood    (  [phone]   [facility]    [phone]   [facility]  [address], [place] Phone:  [phone], Fax:  [phone] Hours of Operation:  9 am - 6 pm, M-F.  Also accepts Medicaid/Medicare and self-pay.  [facility] for [place] [address], [address], [place] Phone: [phone], Fax: [phone]. Hours of Operation:  8:30 am - 5:30 pm, M-F.  Also accepts Medicaid and self-pay.  [facility] [place] [address], [place] Phone: [phone]   [place], [place], [place] [phone], Ext. [phone] Mondays & Thursdays: 7-9 AM.  First 15 patients are seen on a first come, first serve basis.    [place] Providers:  Organization         Address  Phone   Notes  [facility] [address], [place] [phone] Also accepts self-pay patients.  [facility] [address], [place]  [phone]   [place], [address], [place] [phone]   [facility] [address] [place] [phone]   Lucianne Lei [address], [place]   [phone] Only accepts [place] [facility] [place] patients after they have their name applied to their card.   Self-Pay (no insurance) in [facility]:  Organization         Address  Phone   Notes  Sickle Cell Patients, [facility] [place] [phone]   [facility] [place] [phone]   [facility] [place]  [place], [place], [place] [phone]   [facility]/Dr. Osei-Bonsu  [address], [place] or [address], [place] [phone] Phone number for both [place] and [place] locations is the same.  [facility] [address], [place] [phone]   [facility] [address] [place] or [address] [phone]   [facility] [address] [place] [phone], phone; [phone], fax Sees patients 1st and 3rd Saturday of every month.  Must not qualify for public or private insurance (i.e. Medicaid, Medicare, [place] [employer] Choice, Veterans' Benefits)  Household income should be no more than 200% of the poverty level The clinic cannot treat you if you are pregnant or think you are pregnant  Sexually transmitted diseases are not treated at the clinic.

## 2018-01-20 NOTE — ED Notes (Signed)
Pt verbalizes understanding of d/c instructions. Prescriptions reviewed with patient. Pt ambulatory at d/c with all belongings and with family.   

## 2020-10-13 ENCOUNTER — Encounter (HOSPITAL_COMMUNITY): Payer: Self-pay | Admitting: Emergency Medicine

## 2020-10-13 ENCOUNTER — Other Ambulatory Visit: Payer: Self-pay

## 2020-10-13 ENCOUNTER — Emergency Department (HOSPITAL_COMMUNITY)
Admission: EM | Admit: 2020-10-13 | Discharge: 2020-10-13 | Disposition: A | Discharge status: Home / Self Care | Payer: Medicaid Other | Attending: Emergency Medicine | Admitting: Emergency Medicine

## 2020-10-13 DIAGNOSIS — Z202 Contact with and (suspected) exposure to infections with a predominantly sexual mode of transmission: Secondary | ICD-10-CM | POA: Insufficient documentation

## 2020-10-13 DIAGNOSIS — F17210 Nicotine dependence, cigarettes, uncomplicated: Secondary | ICD-10-CM | POA: Insufficient documentation

## 2020-10-13 MED ORDER — CEFTRIAXONE SODIUM 500 MG IJ SOLR
500.0000 mg | Freq: Once | INTRAMUSCULAR | Status: AC
  Administered 2020-10-13: 500 mg via INTRAMUSCULAR
  Filled 2020-10-13: qty 500

## 2020-10-13 MED ORDER — DOXYCYCLINE HYCLATE 100 MG PO CAPS: 100.0000 mg | ORAL_CAPSULE | Freq: Two times a day (BID) | ORAL | 0 refills | Status: DC

## 2020-10-13 MED ORDER — STERILE WATER FOR INJECTION IJ SOLN
INTRAMUSCULAR | Status: AC
  Filled 2020-10-13: qty 10

## 2020-10-13 MED ORDER — DOXYCYCLINE HYCLATE 100 MG PO TABS
100.0000 mg | ORAL_TABLET | Freq: Once | ORAL | Status: AC
  Administered 2020-10-13: 100 mg via ORAL
  Filled 2020-10-13: qty 1

## 2020-10-13 NOTE — Discharge Instructions (Addendum)
Do not have sex for 2 weeks Have all partners tested and treated If your test is abnormal, you will be called but you have been treated for Gonorrhea and Chlamydia today.  Take the rest of the antibiotic until finished. You can also review your results on MyChart, there are instructions on your discharge paperwork on how to download this  Practice safe sex and use a condom to prevent infection or unwanted pregnancy Follow up with the Health Department

## 2020-10-13 NOTE — ED Provider Notes (Signed)
[facility] EMERGENCY DEPARTMENT Provider Note   CSN: [id number] Arrival date & time: 10/13/20  1651     History Chief Complaint  Patient presents with   Exposure to STD    Richard David is a 25 y.o. male.  HPI 25 year old male to the ER with concerns for exposure to STD.  Patient states that he had sexual intercourse with a partner about 6 weeks ago who recently told him that she was positive for chlamydia.  He denies any symptoms at this time.  Denies any scrotal pain, testicular pain, dysuria, hematuria, penile discharge.  Denies any known lesions.    History reviewed. No pertinent past medical history.  Patient Active Problem List   Diagnosis Date Noted   CLOSED FRACTURE OF SHAFT OF TIBIA 12/14/2008    No past surgical history on file.     No family history on file.  Social History   Tobacco Use   Smoking status: Every Day    Packs/day: 0.15    Types: Cigarettes   Smokeless tobacco: Current  Substance Use Topics   Alcohol use: No   Drug use: No    Home Medications Prior to Admission medications   Medication Sig Start Date End Date Taking? Authorizing Provider  doxycycline (VIBRAMYCIN) 100 MG capsule Take 1 capsule (100 mg total) by mouth 2 (two) times daily. 10/13/20  Yes Mare Ferrari, PA-C  cyclobenzaprine (FLEXERIL) 10 MG tablet Take 1 tablet (10 mg total) by mouth 2 (two) times daily as needed for muscle spasms. 10/19/17   McDonald, Mia A, PA-C  naproxen (NAPROSYN) 375 MG tablet Take 1 tablet (375 mg total) by mouth 2 (two) times daily. 01/20/18   Carlyle Basques P, PA-C  ondansetron (ZOFRAN) 4 MG tablet Take 1 tablet (4 mg total) by mouth every 8 (eight) hours as needed for nausea or vomiting. 09/16/16   Tegeler, Canary Brim, MD    Allergies    Patient has no known allergies.  Review of Systems   Review of Systems Ten systems reviewed and are negative for acute change, except as noted in the HPI.   Physical Exam Updated Vital  Signs BP 132/83   Pulse 82   Temp 98.7 F (37.1 C) (Oral)   Resp 12   SpO2 99%   Physical Exam Vitals and nursing note reviewed.  Constitutional:      Appearance: He is well-developed.  HENT:     Head: Normocephalic and atraumatic.  Eyes:     Conjunctiva/sclera: Conjunctivae normal.  Cardiovascular:     Rate and Rhythm: Normal rate and regular rhythm.     Heart sounds: No murmur heard. Pulmonary:     Effort: Pulmonary effort is normal. No respiratory distress.     Breath sounds: Normal breath sounds.  Abdominal:     Palpations: Abdomen is soft.     Tenderness: There is no abdominal tenderness.  Genitourinary:    Comments: GU exam performed with RN at bedside.  Scant white discharge noted at the urethral meatus.  No visible lesions.  No epididymal/testicular tenderness. Musculoskeletal:     Cervical back: Neck supple.  Skin:    General: Skin is warm and dry.  Neurological:     Mental Status: He is alert.    ED Results / Procedures / Treatments   Labs (all labs ordered are listed, but only abnormal results are displayed) Labs Reviewed  GC/CHLAMYDIA PROBE AMP (Katie) NOT AT [facility]    EKG None  Radiology No  results found.  Procedures Procedures   Medications Ordered in ED Medications  sterile water (preservative free) injection (has no administration in time range)  cefTRIAXone (ROCEPHIN) injection 500 mg (500 mg Intramuscular Given 10/13/20 1850)  doxycycline (VIBRA-TABS) tablet 100 mg (100 mg Oral Given 10/13/20 1851)    ED Course  I have reviewed the triage vital signs and the nursing notes.  Pertinent labs & imaging results that were available during my care of the patient were reviewed by me and considered in my medical decision making (see chart for details).    MDM Rules/Calculators/A&P                           Patient is afebrile without abdominal tenderness, abdominal pain or painful bowel movements to indicate prostatitis.  No tenderness to  palpation of the testes or epididymis to suggest orchitis or epididymitis.  STD cultures obtained including gonorrhea and chlamydia. Patient to be discharged with instructions to follow up with PCP. Discussed importance of using protection when sexually active. Pt understands that they have GC/Chlamydia cultures pending and that they will need to inform all sexual partners if results return positive. Patient has been treated prophylactically with doxycycline and Rocephin.    Final Clinical Impression(s) / ED Diagnoses Final diagnoses:  Exposure to STD    Rx / DC Orders ED Discharge Orders          Ordered    doxycycline (VIBRAMYCIN) 100 MG capsule  2 times daily        10/13/20 1840             Mare Ferrari, PA-C 10/13/20 1859    Melene Plan, DO 10/13/20 1902

## 2020-10-13 NOTE — ED Triage Notes (Signed)
Patient here with complaint of having unprotected sexual intercourse six weeks ago with a person that later told him she had chlamydia. Patient denies urinary complaints.

## 2020-10-13 NOTE — ED Provider Notes (Signed)
Emergency Medicine Provider Triage Evaluation Note  Richard David , a 25 y.o. male  was evaluated in triage.  Pt complains of requesting STD testing.  Patient had unprotected intercourse 6 weeks ago and was informed that his partner tested positive for chlamydia.  Denies any penile symptoms.  No abdominal pain, pain with defecation, fever/chills, penile discharge, testicular pain..  Review of Systems  Positive: STD testing Negative: Abdominal pain  Physical Exam  BP 132/83   Pulse 82   Temp 98.7 F (37.1 C) (Oral)   Resp 12   SpO2 99%  Gen:   Awake, no distress   Resp:  Normal effort  MSK:   Moves extremities without difficulty  Other:  GU exam deferred in triage  Medical Decision Making  Medically screening exam initiated at 5:02 PM.  Appropriate orders placed.  Venson Ferencz was informed that the remainder of the evaluation will be completed by another provider, this initial triage assessment does not replace that evaluation, and the importance of remaining in the ED until their evaluation is complete.  Gonorrhea/chlamydia test ordered.  Patient declined HIV and syphilis testing.   Mannie Stabile, PA-C 10/13/20 1703    Rozelle Logan, DO 10/13/20 1746

## 2020-10-13 NOTE — ED Notes (Addendum)
Pt denies urinary sx and penile discharge

## 2021-07-14 ENCOUNTER — Emergency Department (HOSPITAL_COMMUNITY)
Admission: EM | Admit: 2021-07-14 | Discharge: 2021-07-15 | Disposition: A | Discharge status: Home / Self Care | Payer: Self-pay | Attending: Emergency Medicine | Admitting: Emergency Medicine

## 2021-07-14 DIAGNOSIS — S6992XA Unspecified injury of left wrist, hand and finger(s), initial encounter: Secondary | ICD-10-CM | POA: Diagnosis present

## 2021-07-14 DIAGNOSIS — Z23 Encounter for immunization: Secondary | ICD-10-CM | POA: Insufficient documentation

## 2021-07-14 DIAGNOSIS — S62365A Nondisplaced fracture of neck of fourth metacarpal bone, left hand, initial encounter for closed fracture: Secondary | ICD-10-CM | POA: Insufficient documentation

## 2021-07-14 DIAGNOSIS — Y92410 Unspecified street and highway as the place of occurrence of the external cause: Secondary | ICD-10-CM | POA: Diagnosis not present

## 2021-07-14 DIAGNOSIS — S82201A Unspecified fracture of shaft of right tibia, initial encounter for closed fracture: Secondary | ICD-10-CM

## 2021-07-14 DIAGNOSIS — S82401A Unspecified fracture of shaft of right fibula, initial encounter for closed fracture: Secondary | ICD-10-CM | POA: Diagnosis not present

## 2021-07-14 DIAGNOSIS — S82101A Unspecified fracture of upper end of right tibia, initial encounter for closed fracture: Secondary | ICD-10-CM | POA: Diagnosis not present

## 2021-07-14 DIAGNOSIS — S62398A Other fracture of other metacarpal bone, initial encounter for closed fracture: Secondary | ICD-10-CM

## 2021-07-15 ENCOUNTER — Other Ambulatory Visit: Payer: Self-pay

## 2021-07-15 ENCOUNTER — Emergency Department (HOSPITAL_COMMUNITY): Payer: Medicaid Other

## 2021-07-15 ENCOUNTER — Encounter (HOSPITAL_COMMUNITY): Payer: Self-pay | Admitting: *Deleted

## 2021-07-15 MED ORDER — HYDROCODONE-ACETAMINOPHEN 5-325 MG PO TABS: 1.0000 | ORAL_TABLET | Freq: Four times a day (QID) | ORAL | 0 refills | Status: DC | PRN

## 2021-07-15 MED ORDER — TETANUS-DIPHTH-ACELL PERTUSSIS 5-2.5-18.5 LF-MCG/0.5 IM SUSY
0.5000 mL | PREFILLED_SYRINGE | Freq: Once | INTRAMUSCULAR | Status: AC
  Administered 2021-07-15: 0.5 mL via INTRAMUSCULAR
  Filled 2021-07-15: qty 0.5

## 2021-07-15 MED ORDER — HYDROCODONE-ACETAMINOPHEN 5-325 MG PO TABS
2.0000 | ORAL_TABLET | Freq: Once | ORAL | Status: AC
  Administered 2021-07-15: 2 via ORAL
  Filled 2021-07-15: qty 2

## 2021-07-15 NOTE — ED Notes (Signed)
Ortho tech called at this time.  

## 2021-07-15 NOTE — ED Notes (Signed)
Ortho tech at bedside 
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
## 2021-07-15 NOTE — ED Notes (Signed)
Pt verbalized understanding of d/c instructions, meds, and followup care. Denies questions. VSS, no distress noted. Steady gait to exit with all belongings.  ?
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
## 2021-07-15 NOTE — Progress Notes (Signed)
Orthopedic Tech Progress Note Patient Details:  Frans Valente Jul 02, 1995 [id number]  Ortho Devices Type of Ortho Device: Crutches, Volar splint, Knee Immobilizer Ortho Device/Splint Location: rle,lue Ortho Device/Splint Interventions: Ordered, Application, Adjustment   Post Interventions Patient Tolerated: Well Instructions Provided: Adjustment of device, Care of device, Poper ambulation with device  Edrie Ehrich L Daci Stubbe 07/15/2021, 3:54 AM

## 2021-07-15 NOTE — ED Notes (Signed)
Received verbal report from Chris C RN at this time ?

## 2021-07-20 ENCOUNTER — Ambulatory Visit (INDEPENDENT_AMBULATORY_CARE_PROVIDER_SITE_OTHER): Payer: Self-pay | Admitting: Orthopaedic Surgery

## 2021-07-20 DIAGNOSIS — S82201A Unspecified fracture of shaft of right tibia, initial encounter for closed fracture: Secondary | ICD-10-CM

## 2021-07-20 DIAGNOSIS — S62325A Displaced fracture of shaft of fourth metacarpal bone, left hand, initial encounter for closed fracture: Secondary | ICD-10-CM

## 2021-07-20 DIAGNOSIS — S82401A Unspecified fracture of shaft of right fibula, initial encounter for closed fracture: Secondary | ICD-10-CM

## 2021-07-20 MED ORDER — HYDROCODONE-ACETAMINOPHEN 5-325 MG PO TABS: 1.0000 | ORAL_TABLET | Freq: Three times a day (TID) | ORAL | 0 refills | Status: AC | PRN
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
## 2021-07-21 ENCOUNTER — Other Ambulatory Visit: Payer: Medicaid Other

## 2021-07-21 ENCOUNTER — Ambulatory Visit
Admission: RE | Admit: 2021-07-21 | Discharge: 2021-07-21 | Disposition: A | Discharge status: Home / Self Care | Payer: Medicaid Other | Source: Ambulatory Visit | Attending: Orthopaedic Surgery | Admitting: Orthopaedic Surgery

## 2021-07-21 DIAGNOSIS — S82201A Unspecified fracture of shaft of right tibia, initial encounter for closed fracture: Secondary | ICD-10-CM

## 2021-07-21 NOTE — Progress Notes (Signed)
Please let him know that I have reviewed the CT scan and we can treated nonoperatively but he needs to be nonweightbearing.  He should follow-up with [place] in a couple weeks for recheck.  He can begin range of motion immediately.
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
